# Patient Record
Sex: Male | Born: 1970 | Race: Black or African American | Hispanic: No | Marital: Single | Smoking: Never smoker
Health system: Southern US, Community
[De-identification: ages and names within clinical notes are randomized; demographics above are authoritative.]

## PROBLEM LIST (undated history)

## (undated) DIAGNOSIS — K5790 Diverticulosis of intestine, part unspecified, without perforation or abscess without bleeding: Secondary | ICD-10-CM

## (undated) DIAGNOSIS — F1010 Alcohol abuse, uncomplicated: Secondary | ICD-10-CM

## (undated) HISTORY — PX: INGUINAL HERNIA REPAIR: SHX194

## (undated) HISTORY — PX: HERNIA REPAIR: SHX51

## (undated) HISTORY — PX: DIAGNOSTIC LAPAROSCOPY: SUR761

---

## 2009-10-08 ENCOUNTER — Emergency Department (HOSPITAL_BASED_OUTPATIENT_CLINIC_OR_DEPARTMENT_OTHER): Admission: EM | Admit: 2009-10-08 | Discharge: 2009-10-08 | Payer: Self-pay | Admitting: Emergency Medicine

## 2012-02-17 ENCOUNTER — Emergency Department (HOSPITAL_BASED_OUTPATIENT_CLINIC_OR_DEPARTMENT_OTHER): Payer: Self-pay

## 2012-02-17 ENCOUNTER — Encounter (HOSPITAL_BASED_OUTPATIENT_CLINIC_OR_DEPARTMENT_OTHER): Payer: Self-pay | Admitting: *Deleted

## 2012-02-17 ENCOUNTER — Emergency Department (HOSPITAL_BASED_OUTPATIENT_CLINIC_OR_DEPARTMENT_OTHER)
Admission: EM | Admit: 2012-02-17 | Discharge: 2012-02-17 | Disposition: A | Discharge status: Home / Self Care | Payer: Self-pay | Attending: Emergency Medicine | Admitting: Emergency Medicine

## 2012-02-17 DIAGNOSIS — S92919A Unspecified fracture of unspecified toe(s), initial encounter for closed fracture: Secondary | ICD-10-CM | POA: Insufficient documentation

## 2012-02-17 DIAGNOSIS — Y9239 Other specified sports and athletic area as the place of occurrence of the external cause: Secondary | ICD-10-CM | POA: Insufficient documentation

## 2012-02-17 DIAGNOSIS — Y9366 Activity, soccer: Secondary | ICD-10-CM | POA: Insufficient documentation

## 2012-02-17 DIAGNOSIS — W219XXA Striking against or struck by unspecified sports equipment, initial encounter: Secondary | ICD-10-CM | POA: Insufficient documentation

## 2012-02-17 MED ORDER — OXYCODONE-ACETAMINOPHEN 5-325 MG PO TABS: 2.0000 | ORAL_TABLET | ORAL | Status: DC | PRN

## 2012-02-17 MED ORDER — OXYCODONE-ACETAMINOPHEN 5-325 MG PO TABS
2.0000 | ORAL_TABLET | Freq: Once | ORAL | Status: AC
  Administered 2012-02-17: 2 via ORAL
  Filled 2012-02-17 (×2): qty 2

## 2012-02-17 NOTE — ED Provider Notes (Signed)
History     CSN: [id number]  Arrival date & time 02/17/12  1526   First MD Initiated Contact with Patient 02/17/12 1535      Chief Complaint  Patient presents with  . Foot Injury    (Consider location/radiation/quality/duration/timing/severity/associated sxs/prior treatment) HPI Comments: Patient is a 41 year old male who presents with right foot pain that started earlier today while he was playing soccer. The pain started immediately after the patient went to kick the soccer ball and kicked a rock with his right foot. Patient reports sudden onset of throbbing, severe pain that is localized to right foot. Patient reports progressive worsening of pain. Weight bearing activity makes the pain worse. Nothing makes the pain better. Patient reports associated swelling. Patient has not tried anything for pain relief. Patient denies obvious deformity, numbness/tingling, coolness/weakness of extremity, bruising, and any other injury.      History reviewed. No pertinent past medical history.  Past Surgical History  Procedure Date  . Hernia repair     History reviewed. No pertinent family history.  History  Substance Use Topics  . Smoking status: Never Smoker   . Smokeless tobacco: Not on file  . Alcohol Use: Yes      Review of Systems  Musculoskeletal: Positive for joint swelling and arthralgias.  All other systems reviewed and are negative.    Allergies  Review of patient's allergies indicates no known allergies.  Home Medications  No current outpatient prescriptions on file.  BP 138/73  Pulse 92  Temp 98.7 F (37.1 C) (Oral)  Resp 18  Ht 5\' 10"  (1.778 m)  Wt 170 lb (77.111 kg)  BMI 24.39 kg/m2  SpO2 96%  Physical Exam  Nursing note and vitals reviewed. Constitutional: He appears well-developed and well-nourished. No distress.  HENT:  Head: Normocephalic and atraumatic.  Eyes: Conjunctivae normal and EOM are normal.  Neck: Normal range of motion. Neck supple.   Cardiovascular: Normal rate and regular rhythm.  Exam reveals no gallop and no friction rub.   No murmur heard. Pulmonary/Chest: Effort normal and breath sounds normal. He has no wheezes. He has no rales. He exhibits no tenderness.  Abdominal: Soft. He exhibits no distension.  Musculoskeletal: Normal range of motion.       Tenderness to palpation over first metatarsal of right foot. No obvious swelling or deformity. Patient able to actively move toes distal to injury. Some bruising noted over area of tenderness.   Neurological: He is alert.       Speech is goal-oriented. Moves limbs without ataxia.   Skin: Skin is warm and dry. He is not diaphoretic.       See Musculoskeletal.   Psychiatric: He has a normal mood and affect. His behavior is normal.    ED Course  Procedures (including critical care time)  Labs Reviewed - No data to display Dg Foot Complete Right  02/17/2012  *RADIOLOGY REPORT*  Clinical Data: History of trauma with pain in the right great toe.  RIGHT FOOT COMPLETE - 3+ VIEW  Comparison: No priors.  Findings: Three views of the right foot demonstrate a subtle discontinuity of the cortex along the articular surface of the base of the distal phalanx of the great toe, suspicious for an impaction fracture.  This is noted only on the oblique projection, and is not confidently identified on the other views.  No other acute fracture, subluxation or dislocation is noted.  IMPRESSION: 1.  Findings are suspicious for an acute nondisplaced mildly impacted  fracture through the base of the first distal phalanx. The appearance suggests intra-articular extension.  Clinical correlation is recommended.   Original Report Authenticated By: Trudie Reed, M.D.      1. Phalanx fracture, foot       MDM  4:32 PM Right foot xray pending. Patient will have Percocet for pain.   4:56 PM  Xray shows fracture of distal phalanx of great toe of right foot. Patient will have toes buddy taped and  post op shoe. Patient will have recommended Ortho follow up and be discharged with pain medication.        Emilia Beck, PA-C 02/21/12 0000

## 2012-02-17 NOTE — ED Notes (Signed)
Pt states he was kicking a ball and hit an object instead. C/O pain and swelling to the right foot.

## 2012-02-25 NOTE — ED Provider Notes (Signed)
Medical screening examination/treatment/procedure(s) were performed by non-physician practitioner and as supervising physician I was immediately available for consultation/collaboration.   Rolan Bucco, MD 02/25/12 [phone]

## 2015-03-16 ENCOUNTER — Emergency Department (HOSPITAL_BASED_OUTPATIENT_CLINIC_OR_DEPARTMENT_OTHER)
Admission: EM | Admit: 2015-03-16 | Discharge: 2015-03-16 | Disposition: A | Discharge status: Home / Self Care | Payer: Self-pay | Attending: Emergency Medicine | Admitting: Emergency Medicine

## 2015-03-16 ENCOUNTER — Encounter (HOSPITAL_BASED_OUTPATIENT_CLINIC_OR_DEPARTMENT_OTHER): Payer: Self-pay

## 2015-03-16 DIAGNOSIS — J069 Acute upper respiratory infection, unspecified: Secondary | ICD-10-CM | POA: Insufficient documentation

## 2015-03-16 MED ORDER — AMOXICILLIN-POT CLAVULANATE 875-125 MG PO TABS: 1.0000 | ORAL_TABLET | Freq: Two times a day (BID) | ORAL | Status: DC

## 2015-03-16 NOTE — ED Provider Notes (Signed)
CSN: [id number]     Arrival date & time 03/16/15  1302 History   First MD Initiated Contact with Patient 03/16/15 1310     Chief Complaint  Patient presents with  . Nasal Congestion   HPI Daniel Mcmillan is a 44 y.o. M PMH significant for sinusitis presenting with a 4-5 day history of worsening head congestion and sinus pressure. He states he has been having sinus pressure intermittently since the beginning of November. He describes his pressure as frontal and maxillary sinuses, non-radiating, constant, 2/10 pain scale. He has tried OTC meds (Cold & Sinus) without relief. He denies fevers, chills, chest pain, visual changes, N/V/D.   History reviewed. No pertinent past medical history. Past Surgical History  Procedure Laterality Date  . Hernia repair     No family history on file. Social History  Substance Use Topics  . Smoking status: Never Smoker   . Smokeless tobacco: None  . Alcohol Use: Yes     Comment: daily    Review of Systems  Ten systems are reviewed and are negative for acute change except as noted in the HPI  Allergies  Review of patient's allergies indicates no known allergies.  Home Medications   Prior to Admission medications   Not on File   BP 149/107 mmHg  Pulse 63  Temp(Src) 97.6 F (36.4 C) (Oral)  Resp 16  Ht 5\' 10"  (1.778 m)  Wt 79.379 kg  BMI 25.11 kg/m2  SpO2 100% Physical Exam  Constitutional: He appears well-developed and well-nourished. No distress.  HENT:  Head: Normocephalic and atraumatic.  Right Ear: External ear normal.  Left Ear: External ear normal.  Mouth/Throat: Oropharynx is clear and moist. No oropharyngeal exudate.  Swollen turbinates bilaterally. Frontal sinus TTP.   Eyes: Conjunctivae are normal. Pupils are equal, round, and reactive to light. Right eye exhibits no discharge. Left eye exhibits no discharge. No scleral icterus.  Neck: No tracheal deviation present.  Cardiovascular: Normal rate, regular rhythm, normal heart  sounds and intact distal pulses.  Exam reveals no gallop and no friction rub.   No murmur heard. Pulmonary/Chest: Effort normal and breath sounds normal. No respiratory distress. He has no wheezes. He has no rales. He exhibits no tenderness.  Abdominal: Soft. Bowel sounds are normal. He exhibits no distension and no mass. There is no tenderness. There is no rebound and no guarding.  Musculoskeletal: He exhibits no edema.  Lymphadenopathy:    He has no cervical adenopathy.  Neurological: He is alert. Coordination normal.  Skin: Skin is warm and dry. No rash noted. He is not diaphoretic. No erythema.  Psychiatric: He has a normal mood and affect. His behavior is normal.  Nursing note and vitals reviewed.   ED Course  Procedures   MDM   Final diagnoses:  Upper respiratory infection   Patient non-toxic appearing, afebrile. Based on patient history and physical exam, most likely etiology is URI/sinusitis vs primary headache. Less likely etiologies include hypertensive HA, venous sinus thrombosis, aneurysmal SAH, AVM, acute CVA, carotid dissection, ICH, SAH, SDH, EDH, postconcussive syndrome, meningitis, encephalitis, abscess, mass effect, hydrocephalus, pseudotumor cerebri, glaucoma, trigeminal neuralgia, TMJ syndrome, temporal arteritis, mastoiditis, carbon monoxide poisoning, rebound HA.  Due to longevity of symptoms, will prescribe abx. Patient may be safely discharged home. Discussed reasons for return. Patient to follow-up with ENT as needed. Patient in understanding and agreement with the plan.  Orwell Lions, PA-C 03/19/15 [id number]  Leo Grosser, MD 03/21/15 [phone]

## 2015-03-16 NOTE — ED Notes (Signed)
C/o head congestion, sinus pressure x 4-5 days-NAD

## 2015-03-16 NOTE — Discharge Instructions (Signed)
[name],  Nice meeting you! Please follow-up with ear, nose, and throat if your sinus issues persist. Return to the emergency department if you develop fevers, chills, weakness, slurred speech, facial droop, increased headache. Feel better soon!  S. Wendie Simmer, PA-C   Upper Respiratory Infection, Adult Most upper respiratory infections (URIs) are a viral infection of the air passages leading to the lungs. A URI affects the nose, throat, and upper air passages. The most common type of URI is nasopharyngitis and is typically referred to as "the common cold." URIs run their course and usually go away on their own. Most of the time, a URI does not require medical attention, but sometimes a bacterial infection in the upper airways can follow a viral infection. This is called a secondary infection. Sinus and middle ear infections are common types of secondary upper respiratory infections. Bacterial pneumonia can also complicate a URI. A URI can worsen asthma and chronic obstructive pulmonary disease (COPD). Sometimes, these complications can require emergency medical care and may be life threatening.  CAUSES Almost all URIs are caused by viruses. A virus is a type of germ and can spread from one person to another.  RISKS FACTORS You may be at risk for a URI if:   You smoke.   You have chronic heart or lung disease.  You have a weakened defense (immune) system.   You are very young or very old.   You have nasal allergies or asthma.  You work in crowded or poorly ventilated areas.  You work in health care facilities or schools. SIGNS AND SYMPTOMS  Symptoms typically develop 2-3 days after you come in contact with a cold virus. Most viral URIs last 7-10 days. However, viral URIs from the influenza virus (flu virus) can last 14-18 days and are typically more severe. Symptoms may include:   Runny or stuffy (congested) nose.   Sneezing.   Cough.   Sore throat.   Headache.    Fatigue.   Fever.   Loss of appetite.   Pain in your forehead, behind your eyes, and over your cheekbones (sinus pain).  Muscle aches.  DIAGNOSIS  Your health care provider may diagnose a URI by:  Physical exam.  Tests to check that your symptoms are not due to another condition such as:  Strep throat.  Sinusitis.  Pneumonia.  Asthma. TREATMENT  A URI goes away on its own with time. It cannot be cured with medicines, but medicines may be prescribed or recommended to relieve symptoms. Medicines may help:  Reduce your fever.  Reduce your cough.  Relieve nasal congestion. HOME CARE INSTRUCTIONS   Take medicines only as directed by your health care provider.   Gargle warm saltwater or take cough drops to comfort your throat as directed by your health care provider.  Use a warm mist humidifier or inhale steam from a shower to increase air moisture. This may make it easier to breathe.  Drink enough fluid to keep your urine clear or pale yellow.   Eat soups and other clear broths and maintain good nutrition.   Rest as needed.   Return to work when your temperature has returned to normal or as your health care provider advises. You may need to stay home longer to avoid infecting others. You can also use a face mask and careful hand washing to prevent spread of the virus.  Increase the usage of your inhaler if you have asthma.   Do not use any tobacco  products, including cigarettes, chewing tobacco, or electronic cigarettes. If you need help quitting, ask your health care provider. PREVENTION  The best way to protect yourself from getting a cold is to practice good hygiene.   Avoid oral or hand contact with people with cold symptoms.   Wash your hands often if contact occurs.  There is no clear evidence that vitamin C, vitamin E, echinacea, or exercise reduces the chance of developing a cold. However, it is always recommended to get plenty of rest,  exercise, and practice good nutrition.  SEEK MEDICAL CARE IF:   You are getting worse rather than better.   Your symptoms are not controlled by medicine.   You have chills.  You have worsening shortness of breath.  You have brown or red mucus.  You have yellow or brown nasal discharge.  You have pain in your face, especially when you bend forward.  You have a fever.  You have swollen neck glands.  You have pain while swallowing.  You have white areas in the back of your throat. SEEK IMMEDIATE MEDICAL CARE IF:   You have severe or persistent:  Headache.  Ear pain.  Sinus pain.  Chest pain.  You have chronic lung disease and any of the following:  Wheezing.  Prolonged cough.  Coughing up blood.  A change in your usual mucus.  You have a stiff neck.  You have changes in your:  Vision.  Hearing.  Thinking.  Mood. MAKE SURE YOU:   Understand these instructions.  Will watch your condition.  Will get help right away if you are not doing well or get worse.   This information is not intended to replace advice given to you by your health care provider. Make sure you discuss any questions you have with your health care provider.   Document Released: 09/12/2000 Document Revised: 08/03/2014 Document Reviewed: 06/24/2013 Elsevier Interactive Patient Education [employer].

## 2015-09-16 ENCOUNTER — Encounter (HOSPITAL_BASED_OUTPATIENT_CLINIC_OR_DEPARTMENT_OTHER): Payer: Self-pay | Admitting: *Deleted

## 2015-09-16 ENCOUNTER — Emergency Department (HOSPITAL_BASED_OUTPATIENT_CLINIC_OR_DEPARTMENT_OTHER)
Admission: EM | Admit: 2015-09-16 | Discharge: 2015-09-16 | Disposition: A | Discharge status: Home / Self Care | Payer: BC Managed Care – PPO | Attending: Emergency Medicine | Admitting: Emergency Medicine

## 2015-09-16 ENCOUNTER — Emergency Department (HOSPITAL_BASED_OUTPATIENT_CLINIC_OR_DEPARTMENT_OTHER): Payer: BC Managed Care – PPO

## 2015-09-16 DIAGNOSIS — K5732 Diverticulitis of large intestine without perforation or abscess without bleeding: Secondary | ICD-10-CM | POA: Diagnosis not present

## 2015-09-16 DIAGNOSIS — E279 Disorder of adrenal gland, unspecified: Secondary | ICD-10-CM | POA: Insufficient documentation

## 2015-09-16 DIAGNOSIS — R1032 Left lower quadrant pain: Secondary | ICD-10-CM | POA: Diagnosis present

## 2015-09-16 DIAGNOSIS — E278 Other specified disorders of adrenal gland: Secondary | ICD-10-CM

## 2015-09-16 LAB — COMPREHENSIVE METABOLIC PANEL
ALK PHOS: 57 U/L (ref 38–126)
ALT: 24 U/L (ref 17–63)
AST: 19 U/L (ref 15–41)
Albumin: 4.2 g/dL (ref 3.5–5.0)
Anion gap: 11 (ref 5–15)
BILIRUBIN TOTAL: 1.3 mg/dL — AB (ref 0.3–1.2)
BUN: 10 mg/dL (ref 6–20)
CO2: 24 mmol/L (ref 22–32)
CREATININE: 0.94 mg/dL (ref 0.61–1.24)
Calcium: 9.1 mg/dL (ref 8.9–10.3)
Chloride: 100 mmol/L — ABNORMAL LOW (ref 101–111)
GFR calc Af Amer: >60 mL/min (ref >60)
Glucose, Bld: 109 mg/dL — ABNORMAL HIGH (ref 65–99)
Potassium: 3.4 mmol/L — ABNORMAL LOW (ref 3.5–5.1)
Sodium: 135 mmol/L (ref 135–145)
TOTAL PROTEIN: 8.1 g/dL (ref 6.5–8.1)

## 2015-09-16 LAB — URINALYSIS, ROUTINE W REFLEX MICROSCOPIC
Bilirubin Urine: NEGATIVE
GLUCOSE, UA: NEGATIVE mg/dL
Hgb urine dipstick: NEGATIVE
Ketones, ur: 15 mg/dL — AB
LEUKOCYTES UA: NEGATIVE
Nitrite: NEGATIVE
PROTEIN: NEGATIVE mg/dL
SPECIFIC GRAVITY, URINE: 1.008 (ref 1.005–1.030)
pH: 6 (ref 5.0–8.0)

## 2015-09-16 LAB — CBC WITH DIFFERENTIAL/PLATELET
BASOS ABS: 0 10*3/uL (ref 0.0–0.1)
Basophils Relative: 0 %
Eosinophils Absolute: 0 10*3/uL (ref 0.0–0.7)
Eosinophils Relative: 0 %
HEMATOCRIT: 44.2 % (ref 39.0–52.0)
Hemoglobin: 15.5 g/dL (ref 13.0–17.0)
LYMPHS PCT: 6 %
Lymphs Abs: 0.9 10*3/uL (ref 0.7–4.0)
MCH: 28.5 pg (ref 26.0–34.0)
MCHC: 35.1 g/dL (ref 30.0–36.0)
MCV: 81.4 fL (ref 78.0–100.0)
Monocytes Absolute: 0.6 10*3/uL (ref 0.1–1.0)
Monocytes Relative: 4 %
NEUTROS ABS: 14.2 10*3/uL — AB (ref 1.7–7.7)
Neutrophils Relative %: 90 %
Platelets: 201 10*3/uL (ref 150–400)
RBC: 5.43 MIL/uL (ref 4.22–5.81)
RDW: 14.9 % (ref 11.5–15.5)
WBC: 15.7 10*3/uL — AB (ref 4.0–10.5)

## 2015-09-16 LAB — TROPONIN I: Troponin I: <0.03 ng/mL (ref <0.031)

## 2015-09-16 LAB — LIPASE, BLOOD: LIPASE: 14 U/L (ref 11–51)

## 2015-09-16 MED ORDER — HYDROCODONE-ACETAMINOPHEN 5-325 MG PO TABS: 1.0000 | ORAL_TABLET | ORAL | Status: DC | PRN

## 2015-09-16 MED ORDER — METRONIDAZOLE 500 MG PO TABS: 500.0000 mg | ORAL_TABLET | Freq: Three times a day (TID) | ORAL | Status: DC

## 2015-09-16 MED ORDER — SODIUM CHLORIDE 0.9 % IV BOLUS (SEPSIS)
1000.0000 mL | Freq: Once | INTRAVENOUS | Status: AC
  Administered 2015-09-16: 1000 mL via INTRAVENOUS

## 2015-09-16 MED ORDER — ONDANSETRON HCL 4 MG/2ML IJ SOLN
4.0000 mg | Freq: Once | INTRAMUSCULAR | Status: AC
  Administered 2015-09-16: 4 mg via INTRAVENOUS
  Filled 2015-09-16: qty 2

## 2015-09-16 MED ORDER — ONDANSETRON 4 MG PO TBDP: 4.0000 mg | ORAL_TABLET | Freq: Three times a day (TID) | ORAL | Status: DC | PRN

## 2015-09-16 MED ORDER — IOPAMIDOL (ISOVUE-300) INJECTION 61%
100.0000 mL | Freq: Once | INTRAVENOUS | Status: AC | PRN
  Administered 2015-09-16: 100 mL via INTRAVENOUS

## 2015-09-16 MED ORDER — HYDROCODONE-ACETAMINOPHEN 5-325 MG PO TABS
1.0000 | ORAL_TABLET | Freq: Once | ORAL | Status: AC
  Administered 2015-09-16: 1 via ORAL
  Filled 2015-09-16: qty 1

## 2015-09-16 MED ORDER — CIPROFLOXACIN HCL 500 MG PO TABS: 500.0000 mg | ORAL_TABLET | Freq: Two times a day (BID) | ORAL | Status: DC

## 2015-09-16 NOTE — ED Notes (Signed)
Abdominal pain since yesterday that felt like gas. He gets some relief with passing gas and belching. He vomited yesterday. Diaphoretic on arrival. He states on the way here he felt like he was going to pass out. He took a laxative yesterday and had a small BM with some relief.

## 2015-09-16 NOTE — ED Provider Notes (Signed)
CSN: [id number]     Arrival date & time 09/16/15  1128 History   First MD Initiated Contact with Patient 09/16/15 1200     Chief Complaint  Patient presents with  . Abdominal Pain     (Consider location/radiation/quality/duration/timing/severity/associated sxs/prior Treatment) HPI   Abdominal pain starting yesterday, mostly lower part of abdomen, worse on the left lower side, mild relief with belching or passing gas. Pressure like pain.   Felt better last night after sodas and beer (belching) but then came back again at [facility].  Threw up once last night.  No CP/SOB. On the way here felt lightheaded, hot.  3 loose stool (took laxatives). Has had symptoms like this, however not has painful.     Father has hx of diverticulitis History reviewed. No pertinent past medical history. Past Surgical History  Procedure Laterality Date  . Hernia repair     No family history on file. Social History  Substance Use Topics  . Smoking status: Never Smoker   . Smokeless tobacco: None  . Alcohol Use: Yes     Comment: daily    Review of Systems  Constitutional: Negative for fever and chills.  HENT: Negative for sore throat.   Eyes: Negative for visual disturbance.  Respiratory: Negative for shortness of breath.   Cardiovascular: Negative for chest pain.  Gastrointestinal: Positive for nausea, vomiting, abdominal pain and diarrhea. Negative for constipation.  Genitourinary: Negative for dysuria and difficulty urinating.  Musculoskeletal: Negative for back pain and neck stiffness.  Skin: Negative for rash.  Neurological: Negative for syncope and headaches.      Allergies  Review of patient's allergies indicates no known allergies.  Home Medications   Prior to Admission medications   Medication Sig Start Date End Date Taking? Authorizing Provider  amoxicillin-clavulanate (AUGMENTIN) 875-125 MG tablet Take 1 tablet by mouth 2 (two) times daily. 03/16/15   Adak Lions, PA-C   ciprofloxacin (CIPRO) 500 MG tablet Take 1 tablet (500 mg total) by mouth every 12 (twelve) hours. 09/16/15   Gareth Morgan, MD  HYDROcodone-acetaminophen (NORCO/VICODIN) 5-325 MG tablet Take 1-2 tablets by mouth every 4 (four) hours as needed. 09/16/15   Gareth Morgan, MD  metroNIDAZOLE (FLAGYL) 500 MG tablet Take 1 tablet (500 mg total) by mouth 3 (three) times daily. 09/16/15   Gareth Morgan, MD  ondansetron (ZOFRAN ODT) 4 MG disintegrating tablet Take 1 tablet (4 mg total) by mouth every 8 (eight) hours as needed for nausea or vomiting. 09/16/15   Gareth Morgan, MD   BP 138/91 mmHg  Pulse 89  Temp(Src) 98.3 F (36.8 C) (Oral)  Resp 16  Ht 5\' 10"  (1.778 m)  Wt 172 lb (78.019 kg)  BMI 24.68 kg/m2  SpO2 100% Physical Exam  Constitutional: He is oriented to person, place, and time. He appears well-developed and well-nourished. No distress.  HENT:  Head: Normocephalic and atraumatic.  Eyes: Conjunctivae and EOM are normal.  Neck: Normal range of motion.  Cardiovascular: Normal rate, regular rhythm, normal heart sounds and intact distal pulses.  Exam reveals no gallop and no friction rub.   No murmur heard. Pulmonary/Chest: Effort normal and breath sounds normal. No respiratory distress. He has no wheezes. He has no rales.  Abdominal: Soft. He exhibits no distension. There is tenderness in the suprapubic area and left lower quadrant. There is no guarding and no CVA tenderness.  Musculoskeletal: He exhibits no edema.  Neurological: He is alert and oriented to person, place, and time.  Skin: Skin is  warm and dry. He is not diaphoretic.  Nursing note and vitals reviewed.   ED Course  Procedures (including critical care time) Labs Review Labs Reviewed  URINALYSIS, ROUTINE W REFLEX MICROSCOPIC (NOT AT [facility]) - Abnormal; Notable for the following:    Ketones, ur 15 (*)    All other components within normal limits  CBC WITH DIFFERENTIAL/PLATELET - Abnormal; Notable for the following:     WBC 15.7 (*)    Neutro Abs 14.2 (*)    All other components within normal limits  COMPREHENSIVE METABOLIC PANEL - Abnormal; Notable for the following:    Potassium 3.4 (*)    Chloride 100 (*)    Glucose, Bld 109 (*)    Total Bilirubin 1.3 (*)    All other components within normal limits  LIPASE, BLOOD  TROPONIN I    Imaging Review Ct Abdomen Pelvis W Contrast  09/16/2015  CLINICAL DATA:  Abdominal pain. Evaluate for diverticulitis. Vomiting. EXAM: CT ABDOMEN AND PELVIS WITH CONTRAST TECHNIQUE: Multidetector CT imaging of the abdomen and pelvis was performed using the standard protocol following bolus administration of intravenous contrast. CONTRAST:  177mL ISOVUE-300 IOPAMIDOL (ISOVUE-300) INJECTION 61% COMPARISON:  None. FINDINGS: Lower chest: Normal heart size. No consolidative or nodular pulmonary opacities. No pleural effusion. Hepatobiliary: Liver is normal in size and contour. No focal hepatic lesion is identified. No intrahepatic or extrahepatic biliary ductal dilatation. Too small to characterize sub cm low-attenuation lesion posterior right hepatic lobe (image 27 ; series 2). Pancreas: Unremarkable Spleen: Unremarkable Adrenals/Urinary Tract: Indeterminate 1.9 cm left adrenal nodule with an internal density of 45 Hounsfield units. Right adrenal gland is normal. Kidneys enhance symmetrically with contrast. No hydronephrosis. Urinary bladder is unremarkable. Stomach/Bowel: Circumferential wall thickening of the sigmoid colon with pericolonic fat stranding a small amount of adjacent free fluid. No evidence for abscess formation. Normal appendix. No evidence for bowel obstruction. Normal morphology of the stomach. No free intraperitoneal air. Vascular/Lymphatic: Normal caliber abdominal aorta. No retroperitoneal lymphadenopathy. Other: None. Musculoskeletal: No aggressive or acute appearing osseous lesions. IMPRESSION: Circumferential wall thickening of the sigmoid colon most compatible with  focal acute diverticulitis and/or short segment colitis. No evidence for perforation or surrounding abscess formation. Given the appearance on CT, recommend correlation with colonoscopy after resolution of the acute symptomatology to exclude the possibility of underlying colonic mass. Indeterminate left adrenal nodule measuring 1.9 cm. Recommend follow-up adrenal protocol MRI or CT in 12 months. Electronically Signed   By: Lovey Newcomer M.D.   On: 09/16/2015 14:33   I have personally reviewed and evaluated these images and lab results as part of my medical decision-making.   EKG Interpretation   Date/Time:  Friday September 16 2015 12:42:29 EDT Ventricular Rate:  87 PR Interval:  150 QRS Duration: 139 QT Interval:  402 QTC Calculation: 484 R Axis:   65 Text Interpretation:  Sinus rhythm Probable left atrial enlargement Right  bundle branch block No previous ECGs available Confirmed by [facility] MD,  Duante Arocho ([id number]) on 09/16/2015 12:55:13 PM      MDM   Final diagnoses:  Diverticulitis of large intestine without perforation or abscess without bleeding  Adrenal nodule [facility])   45yo male with no significant medical history presents with concern for lower abdominal pain worse in the LLQ.  Exam, history and CT concerning for acute diverticulitis without perforation or abscess.  Given rx for cipro/flagyl and rx for norco/zofran and recommend PCP follow up as well as GI follow up for colonoscopy.  Also recommend 42mo  follow up for adrenal CT/MR.  Patient discharged in stable condition with understanding of reasons to return.   Gareth Morgan, MD 09/16/15 2139

## 2015-09-16 NOTE — ED Notes (Signed)
Patient reports pain and reports discomfort while sitting down but refuses pain medication.

## 2015-11-24 ENCOUNTER — Ambulatory Visit: Payer: BC Managed Care – PPO | Admitting: Family Medicine

## 2016-01-02 ENCOUNTER — Ambulatory Visit: Payer: BC Managed Care – PPO | Admitting: Family Medicine

## 2016-01-02 DIAGNOSIS — Z0289 Encounter for other administrative examinations: Secondary | ICD-10-CM

## 2016-01-03 ENCOUNTER — Telehealth: Payer: Self-pay

## 2016-01-03 NOTE — Telephone Encounter (Signed)
Patient was No Show for New Patient appointment 10/2, and on 8/24. Charge or No Charge?

## 2016-01-03 NOTE — Telephone Encounter (Signed)
If he has now had 2 no shows please charge and do not reschedule to see me- thank you

## 2017-03-21 ENCOUNTER — Encounter (HOSPITAL_BASED_OUTPATIENT_CLINIC_OR_DEPARTMENT_OTHER): Payer: Self-pay | Admitting: Emergency Medicine

## 2017-03-21 ENCOUNTER — Other Ambulatory Visit: Payer: Self-pay

## 2017-03-21 ENCOUNTER — Emergency Department (HOSPITAL_BASED_OUTPATIENT_CLINIC_OR_DEPARTMENT_OTHER)
Admission: EM | Admit: 2017-03-21 | Discharge: 2017-03-21 | Disposition: A | Discharge status: Home / Self Care | Payer: BC Managed Care – PPO | Attending: Emergency Medicine | Admitting: Emergency Medicine

## 2017-03-21 DIAGNOSIS — N3000 Acute cystitis without hematuria: Secondary | ICD-10-CM

## 2017-03-21 DIAGNOSIS — Z79899 Other long term (current) drug therapy: Secondary | ICD-10-CM | POA: Insufficient documentation

## 2017-03-21 DIAGNOSIS — N3090 Cystitis, unspecified without hematuria: Secondary | ICD-10-CM | POA: Insufficient documentation

## 2017-03-21 LAB — URINALYSIS, ROUTINE W REFLEX MICROSCOPIC
Bilirubin Urine: NEGATIVE
GLUCOSE, UA: 100 mg/dL — AB
KETONES UR: NEGATIVE mg/dL
LEUKOCYTES UA: NEGATIVE
Nitrite: POSITIVE — AB
PH: 5.5 (ref 5.0–8.0)
Protein, ur: 100 mg/dL — AB

## 2017-03-21 LAB — URINALYSIS, MICROSCOPIC (REFLEX)
BACTERIA UA: NONE SEEN
SQUAMOUS EPITHELIAL / LPF: NONE SEEN

## 2017-03-21 MED ORDER — CEPHALEXIN 500 MG PO CAPS: 500.0000 mg | ORAL_CAPSULE | Freq: Three times a day (TID) | ORAL | 0 refills | Status: DC

## 2017-03-21 MED FILL — CEPHALEXIN 500 MG CAPSULE: 500 | 7 days supply | Qty: 21 | Fill #0

## 2017-03-21 NOTE — ED Triage Notes (Signed)
Pt c/o urinary frequency and discomfort with urination

## 2017-03-21 NOTE — ED Provider Notes (Signed)
[facility] EMERGENCY DEPARTMENT Provider Note   CSN: [id number] Arrival date & time: 03/21/17  0630     History   Chief Complaint Chief Complaint  Patient presents with  . Urinary Frequency    HPI Daniel Mcmillan is a 46 y.o. male.  The history is provided by the patient. No language interpreter was used.  Urinary Frequency    Daniel Mcmillan is a 46 y.o. male who presents to the Emergency Department complaining of urinary frequency.  He reports 1 week of urinary frequency and urgency.  Symptoms have been constant and moderate in nature.  Over the last 2 days he has tried Azo with partial improvement.  No fevers, nausea, vomiting.  He does have some dysuria at the tip of his penis at the end of urination.  No prior similar symptoms.  He has no medical problems and takes no medications. History reviewed. No pertinent past medical history.  There are no active problems to display for this patient.   Past Surgical History:  Procedure Laterality Date  . HERNIA REPAIR         Home Medications    Prior to Admission medications   Medication Sig Start Date End Date Taking? Authorizing Provider  Phenazopyridine HCl (AZO TABS PO) Take by mouth.   Yes [provider]    Family History No family history on file.  Social History Social History   Tobacco Use  . Smoking status: Never Smoker  . Smokeless tobacco: Never Used  Substance Use Topics  . Alcohol use: Yes    Comment: daily  . Drug use: Yes    Types: Marijuana     Allergies   Patient has no known allergies.   Review of Systems Review of Systems  Genitourinary: Positive for frequency.  All other systems reviewed and are negative.    Physical Exam Updated Vital Signs BP (!) 162/94 (BP Location: Left Arm)   Pulse 91   Temp 97.9 F (36.6 C) (Oral)   Resp 18   Ht 5\' 10"  (1.778 m)   Wt 77.1 kg (170 lb)   SpO2 100%   BMI 24.39 kg/m   Physical Exam  Constitutional: He is oriented to  person, place, and time. He appears well-developed and well-nourished.  HENT:  Head: Normocephalic and atraumatic.  Cardiovascular: Normal rate and regular rhythm.  No murmur heard. Pulmonary/Chest: Effort normal and breath sounds normal. No respiratory distress.  Abdominal: Soft. There is no tenderness. There is no rebound and no guarding.  Genitourinary: Rectum normal and penis normal.  Genitourinary Comments: No penile lesions.  No inguinal lymphadenopathy.  No prostatic tenderness to palpation.  Musculoskeletal: He exhibits no edema or tenderness.  Neurological: He is alert and oriented to person, place, and time.  Skin: Skin is warm and dry.  Psychiatric: He has a normal mood and affect. His behavior is normal.  Nursing note and vitals reviewed.    ED Treatments / Results  Labs (all labs ordered are listed, but only abnormal results are displayed) Labs Reviewed  URINALYSIS, ROUTINE W REFLEX MICROSCOPIC - Abnormal; Notable for the following components:      Result Value   Color, Urine ORANGE (*)    Specific Gravity, Urine >1.030 (*)    Glucose, UA 100 (*)    Hgb urine dipstick SMALL (*)    Protein, ur 100 (*)    Nitrite POSITIVE (*)    All other components within normal limits  URINE CULTURE  URINALYSIS, MICROSCOPIC (REFLEX)  GC/CHLAMYDIA PROBE AMP ([place]) NOT AT [facility]    EKG  EKG Interpretation None       Radiology No results found.  Procedures Procedures (including critical care time)  Medications Ordered in ED Medications - No data to display   Initial Impression / Assessment and Plan / ED Course  I have reviewed the triage vital signs and the nursing notes.  Pertinent labs & imaging results that were available during my care of the patient were reviewed by me and considered in my medical decision making (see chart for details).     Patient here for evaluation of 1 week of urinary frequency and urgency.  He is nontoxic appearing on examination.   UA is difficult to interpret given patient's Pyridium use.  Will treat for cystitis based on his symptoms.  No clinical evidence of prostatitis.  Presentation is not consistent with obstructing stone, renal failure.  Counseled patient on home care for cystitis.  Discussed outpatient follow-up and return precautions.  Final Clinical Impressions(s) / ED Diagnoses   Final diagnoses:  Acute cystitis without hematuria    ED Discharge Orders    None       Quintella Reichert, MD 03/21/17 [phone]

## 2017-03-22 LAB — URINE CULTURE: CULTURE: NO GROWTH

## 2017-03-22 LAB — GC/CHLAMYDIA PROBE AMP (~~LOC~~) NOT AT ARMC
Chlamydia: NEGATIVE
NEISSERIA GONORRHEA: NEGATIVE

## 2017-05-09 ENCOUNTER — Other Ambulatory Visit: Payer: Self-pay

## 2017-05-09 ENCOUNTER — Encounter (HOSPITAL_BASED_OUTPATIENT_CLINIC_OR_DEPARTMENT_OTHER): Payer: Self-pay | Admitting: Emergency Medicine

## 2017-05-09 ENCOUNTER — Inpatient Hospital Stay (HOSPITAL_BASED_OUTPATIENT_CLINIC_OR_DEPARTMENT_OTHER)
Admission: EM | Admit: 2017-05-09 | Discharge: 2017-05-17 | DRG: 391 | Disposition: A | Discharge status: Home / Self Care | Payer: Self-pay | Attending: Surgery | Admitting: Surgery

## 2017-05-09 ENCOUNTER — Emergency Department (HOSPITAL_BASED_OUTPATIENT_CLINIC_OR_DEPARTMENT_OTHER): Payer: Self-pay

## 2017-05-09 DIAGNOSIS — K5720 Diverticulitis of large intestine with perforation and abscess without bleeding: Principal | ICD-10-CM | POA: Diagnosis present

## 2017-05-09 DIAGNOSIS — F1010 Alcohol abuse, uncomplicated: Secondary | ICD-10-CM | POA: Diagnosis present

## 2017-05-09 DIAGNOSIS — E876 Hypokalemia: Secondary | ICD-10-CM | POA: Diagnosis present

## 2017-05-09 DIAGNOSIS — D649 Anemia, unspecified: Secondary | ICD-10-CM

## 2017-05-09 DIAGNOSIS — B9620 Unspecified Escherichia coli [E. coli] as the cause of diseases classified elsewhere: Secondary | ICD-10-CM | POA: Diagnosis present

## 2017-05-09 DIAGNOSIS — I10 Essential (primary) hypertension: Secondary | ICD-10-CM | POA: Diagnosis present

## 2017-05-09 DIAGNOSIS — D729 Disorder of white blood cells, unspecified: Secondary | ICD-10-CM

## 2017-05-09 DIAGNOSIS — K5792 Diverticulitis of intestine, part unspecified, without perforation or abscess without bleeding: Secondary | ICD-10-CM

## 2017-05-09 DIAGNOSIS — R1032 Left lower quadrant pain: Secondary | ICD-10-CM

## 2017-05-09 DIAGNOSIS — K5900 Constipation, unspecified: Secondary | ICD-10-CM | POA: Diagnosis present

## 2017-05-09 DIAGNOSIS — D473 Essential (hemorrhagic) thrombocythemia: Secondary | ICD-10-CM

## 2017-05-09 DIAGNOSIS — F1290 Cannabis use, unspecified, uncomplicated: Secondary | ICD-10-CM | POA: Diagnosis present

## 2017-05-09 DIAGNOSIS — Z1611 Resistance to penicillins: Secondary | ICD-10-CM | POA: Diagnosis present

## 2017-05-09 DIAGNOSIS — D75839 Thrombocytosis, unspecified: Secondary | ICD-10-CM

## 2017-05-09 DIAGNOSIS — K651 Peritoneal abscess: Secondary | ICD-10-CM | POA: Diagnosis present

## 2017-05-09 HISTORY — DX: Diverticulosis of intestine, part unspecified, without perforation or abscess without bleeding: K57.90

## 2017-05-09 LAB — COMPREHENSIVE METABOLIC PANEL
ALT: 15 U/L — ABNORMAL LOW (ref 17–63)
ANION GAP: 10 (ref 5–15)
AST: 16 U/L (ref 15–41)
Albumin: 3.4 g/dL — ABNORMAL LOW (ref 3.5–5.0)
Alkaline Phosphatase: 59 U/L (ref 38–126)
BUN: 8 mg/dL (ref 6–20)
CHLORIDE: 101 mmol/L (ref 101–111)
CO2: 26 mmol/L (ref 22–32)
Calcium: 8.5 mg/dL — ABNORMAL LOW (ref 8.9–10.3)
Creatinine, Ser: 1 mg/dL (ref 0.61–1.24)
GFR calc non Af Amer: >60 mL/min (ref >60)
Glucose, Bld: 96 mg/dL (ref 65–99)
POTASSIUM: 3.6 mmol/L (ref 3.5–5.1)
SODIUM: 137 mmol/L (ref 135–145)
Total Bilirubin: 0.3 mg/dL (ref 0.3–1.2)
Total Protein: 7.8 g/dL (ref 6.5–8.1)

## 2017-05-09 LAB — URINALYSIS, ROUTINE W REFLEX MICROSCOPIC
BILIRUBIN URINE: NEGATIVE
Glucose, UA: NEGATIVE mg/dL
HGB URINE DIPSTICK: NEGATIVE
Ketones, ur: NEGATIVE mg/dL
Leukocytes, UA: NEGATIVE
Nitrite: NEGATIVE
PROTEIN: NEGATIVE mg/dL
Specific Gravity, Urine: 1.01 (ref 1.005–1.030)
pH: 7.5 (ref 5.0–8.0)

## 2017-05-09 LAB — CBC WITH DIFFERENTIAL/PLATELET
BASOS PCT: 0 %
Basophils Absolute: 0 10*3/uL (ref 0.0–0.1)
EOS ABS: 0.1 10*3/uL (ref 0.0–0.7)
EOS PCT: 1 %
HCT: 37.7 % — ABNORMAL LOW (ref 39.0–52.0)
HEMOGLOBIN: 12.8 g/dL — AB (ref 13.0–17.0)
LYMPHS PCT: 14 %
Lymphs Abs: 1.6 10*3/uL (ref 0.7–4.0)
MCH: 27.4 pg (ref 26.0–34.0)
MCHC: 34 g/dL (ref 30.0–36.0)
MCV: 80.6 fL (ref 78.0–100.0)
Monocytes Absolute: 1 10*3/uL (ref 0.1–1.0)
Monocytes Relative: 9 %
NEUTROS PCT: 76 %
Neutro Abs: 8.4 10*3/uL — ABNORMAL HIGH (ref 1.7–7.7)
Platelets: 431 10*3/uL — ABNORMAL HIGH (ref 150–400)
RBC: 4.68 MIL/uL (ref 4.22–5.81)
RDW: 13.8 % (ref 11.5–15.5)
WBC: 11.1 10*3/uL — ABNORMAL HIGH (ref 4.0–10.5)

## 2017-05-09 MED ORDER — ONDANSETRON HCL 4 MG/2ML IJ SOLN: 4.0000 mg | Freq: Four times a day (QID) | INTRAMUSCULAR | Status: DC | PRN

## 2017-05-09 MED ORDER — LACTATED RINGERS IV BOLUS (SEPSIS)
1000.0000 mL | Freq: Once | INTRAVENOUS | Status: AC
  Administered 2017-05-09: 1000 mL via INTRAVENOUS

## 2017-05-09 MED ORDER — PIPERACILLIN-TAZOBACTAM 3.375 G IVPB
3.3750 g | Freq: Three times a day (TID) | INTRAVENOUS | Status: DC
  Administered 2017-05-09 – 2017-05-16 (×20): 3.375 g via INTRAVENOUS
  Filled 2017-05-09 (×20): qty 50

## 2017-05-09 MED ORDER — ONDANSETRON 4 MG PO TBDP: 4.0000 mg | ORAL_TABLET | Freq: Four times a day (QID) | ORAL | Status: DC | PRN

## 2017-05-09 MED ORDER — LACTATED RINGERS IV BOLUS (SEPSIS): 1000.0000 mL | Freq: Three times a day (TID) | INTRAVENOUS | Status: AC | PRN

## 2017-05-09 MED ORDER — THIAMINE HCL 100 MG/ML IJ SOLN: 100.0000 mg | Freq: Every day | INTRAMUSCULAR | Status: DC

## 2017-05-09 MED ORDER — FOLIC ACID 1 MG PO TABS
1.0000 mg | ORAL_TABLET | Freq: Every day | ORAL | Status: DC
  Administered 2017-05-09 – 2017-05-17 (×9): 1 mg via ORAL
  Filled 2017-05-09 (×9): qty 1

## 2017-05-09 MED ORDER — IOPAMIDOL (ISOVUE-300) INJECTION 61%
100.0000 mL | Freq: Once | INTRAVENOUS | Status: AC | PRN
  Administered 2017-05-09: 100 mL via INTRAVENOUS

## 2017-05-09 MED ORDER — LACTATED RINGERS IV SOLN
INTRAVENOUS | Status: DC
  Administered 2017-05-09 – 2017-05-16 (×12): via INTRAVENOUS

## 2017-05-09 MED ORDER — LORAZEPAM 1 MG PO TABS: 1.0000 mg | ORAL_TABLET | Freq: Four times a day (QID) | ORAL | Status: AC | PRN

## 2017-05-09 MED ORDER — PROCHLORPERAZINE EDISYLATE 5 MG/ML IJ SOLN: 5.0000 mg | INTRAMUSCULAR | Status: DC | PRN

## 2017-05-09 MED ORDER — MORPHINE SULFATE (PF) 4 MG/ML IV SOLN
4.0000 mg | Freq: Once | INTRAVENOUS | Status: AC
  Administered 2017-05-09: 4 mg via INTRAVENOUS
  Filled 2017-05-09: qty 1

## 2017-05-09 MED ORDER — GUAIFENESIN-DM 100-10 MG/5ML PO SYRP: 10.0000 mL | ORAL_SOLUTION | ORAL | Status: DC | PRN

## 2017-05-09 MED ORDER — METOCLOPRAMIDE HCL 5 MG/ML IJ SOLN: 10.0000 mg | Freq: Four times a day (QID) | INTRAMUSCULAR | Status: DC | PRN

## 2017-05-09 MED ORDER — DIPHENHYDRAMINE HCL 50 MG/ML IJ SOLN: 12.5000 mg | Freq: Four times a day (QID) | INTRAMUSCULAR | Status: DC | PRN

## 2017-05-09 MED ORDER — MENTHOL 3 MG MT LOZG: 1.0000 | LOZENGE | OROMUCOSAL | Status: DC | PRN

## 2017-05-09 MED ORDER — ACETAMINOPHEN 325 MG PO TABS: 650.0000 mg | ORAL_TABLET | Freq: Four times a day (QID) | ORAL | Status: DC | PRN

## 2017-05-09 MED ORDER — PIPERACILLIN-TAZOBACTAM 3.375 G IVPB 30 MIN
3.3750 g | Freq: Once | INTRAVENOUS | Status: AC
  Administered 2017-05-09: 3.375 g via INTRAVENOUS
  Filled 2017-05-09 (×2): qty 50

## 2017-05-09 MED ORDER — LORAZEPAM 2 MG/ML IJ SOLN: 1.0000 mg | Freq: Four times a day (QID) | INTRAMUSCULAR | Status: AC | PRN

## 2017-05-09 MED ORDER — VITAMIN B-1 100 MG PO TABS
100.0000 mg | ORAL_TABLET | Freq: Every day | ORAL | Status: DC
  Administered 2017-05-09 – 2017-05-17 (×9): 100 mg via ORAL
  Filled 2017-05-09 (×9): qty 1

## 2017-05-09 MED ORDER — DIPHENHYDRAMINE HCL 12.5 MG/5ML PO ELIX: 12.5000 mg | ORAL_SOLUTION | Freq: Four times a day (QID) | ORAL | Status: DC | PRN

## 2017-05-09 MED ORDER — ADULT MULTIVITAMIN W/MINERALS CH
1.0000 | ORAL_TABLET | Freq: Every day | ORAL | Status: DC
  Administered 2017-05-09 – 2017-05-17 (×9): 1 via ORAL
  Filled 2017-05-09 (×9): qty 1

## 2017-05-09 MED ORDER — MAGIC MOUTHWASH
15.0000 mL | Freq: Four times a day (QID) | ORAL | Status: DC | PRN
  Filled 2017-05-09: qty 15

## 2017-05-09 MED ORDER — ONDANSETRON HCL 4 MG/2ML IJ SOLN
4.0000 mg | Freq: Once | INTRAMUSCULAR | Status: AC
  Administered 2017-05-09: 4 mg via INTRAVENOUS
  Filled 2017-05-09: qty 2

## 2017-05-09 MED ORDER — METHOCARBAMOL 1000 MG/10ML IJ SOLN
1000.0000 mg | Freq: Four times a day (QID) | INTRAVENOUS | Status: DC | PRN
  Administered 2017-05-10 (×2): 1000 mg via INTRAVENOUS
  Filled 2017-05-09 (×3): qty 10

## 2017-05-09 MED ORDER — ENOXAPARIN SODIUM 40 MG/0.4ML ~~LOC~~ SOLN
40.0000 mg | SUBCUTANEOUS | Status: DC
  Filled 2017-05-09 (×2): qty 0.4

## 2017-05-09 MED ORDER — METOCLOPRAMIDE HCL 5 MG/ML IJ SOLN
10.0000 mg | Freq: Once | INTRAMUSCULAR | Status: AC
  Administered 2017-05-09: 10 mg via INTRAVENOUS
  Filled 2017-05-09: qty 2

## 2017-05-09 MED ORDER — HYDROMORPHONE HCL 1 MG/ML IJ SOLN
0.5000 mg | INTRAMUSCULAR | Status: DC | PRN
  Administered 2017-05-09 – 2017-05-10 (×6): 0.5 mg via INTRAVENOUS
  Filled 2017-05-09: qty 0.5
  Filled 2017-05-09: qty 1
  Filled 2017-05-09 (×4): qty 0.5

## 2017-05-09 MED ORDER — ALUM & MAG HYDROXIDE-SIMETH 200-200-20 MG/5ML PO SUSP: 30.0000 mL | Freq: Four times a day (QID) | ORAL | Status: DC | PRN

## 2017-05-09 MED ORDER — ACETAMINOPHEN 650 MG RE SUPP: 650.0000 mg | Freq: Four times a day (QID) | RECTAL | Status: DC | PRN

## 2017-05-09 MED ORDER — PHENOL 1.4 % MT LIQD: 1.0000 | OROMUCOSAL | Status: DC | PRN

## 2017-05-09 MED ORDER — SODIUM CHLORIDE 0.9 % IV BOLUS (SEPSIS)
500.0000 mL | Freq: Once | INTRAVENOUS | Status: AC
  Administered 2017-05-09: 500 mL via INTRAVENOUS

## 2017-05-09 MED ORDER — HYDROCORTISONE 1 % EX CREA: 1.0000 "application " | TOPICAL_CREAM | Freq: Three times a day (TID) | CUTANEOUS | Status: DC | PRN

## 2017-05-09 MED ORDER — SIMETHICONE 80 MG PO CHEW: 40.0000 mg | CHEWABLE_TABLET | Freq: Four times a day (QID) | ORAL | Status: DC | PRN

## 2017-05-09 MED ORDER — HYDROCORTISONE 2.5 % RE CREA: 1.0000 "application " | TOPICAL_CREAM | Freq: Four times a day (QID) | RECTAL | Status: DC | PRN

## 2017-05-09 NOTE — Progress Notes (Signed)
Request received for pelvic abscess drainage placement on patient.  Imaging studies were reviewed by Dr. Vernard Gambles.  At this time abscess is not safely accessible for percutaneous drainage.  Above discussed with Dr. Johney Maine.

## 2017-05-09 NOTE — ED Triage Notes (Signed)
Per [facility] pt is coming from [facility] has had LLQ pain x4 days. CT showed abscess and diverticulosis.

## 2017-05-09 NOTE — ED Notes (Signed)
Pt leaving Ed with [facility] at this time.

## 2017-05-09 NOTE — ED Provider Notes (Signed)
Emergency Department Provider Note   I have reviewed the triage vital signs and the nursing notes.   HISTORY  Chief Complaint Abdominal Pain   HPI Daniel Mcmillan is a 47 y.o. male with PMH of diverticulitis presents to the emergency department for evaluation of left lower quadrant pain.  Pain began 2 weeks ago and then seemed to improve.  Several days ago the pain returned.  He has had some associated bloating and constipation.  Denies any diarrhea or blood in the stools.  The patient states that since 2017 had approximately 7 flares of a similar type pain.  He was diagnosed with diverticulitis in the past.  He has not followed up with a gastroenterologist or had a colonoscopy to further investigate the symptoms.  He denies any pain in the testicles.  No dysuria, hesitancy, urgency.  Pain is worse with movement or pressing in the left lower quadrant.  Denies dysuria, hesitancy, urgency.  No chest discomfort.   Past Medical History:  Diagnosis Date  . Diverticulosis     There are no active problems to display for this patient.   Past Surgical History:  Procedure Laterality Date  . HERNIA REPAIR      Current Outpatient Rx  . Order #: [id number] Class: Print  . Order #: [id number] Class: Historical Med    Allergies Patient has no known allergies.  No family history on file.  Social History Social History   Tobacco Use  . Smoking status: Never Smoker  . Smokeless tobacco: Never Used  Substance Use Topics  . Alcohol use: Yes    Alcohol/week: 3.0 oz    Types: 5 Cans of beer per week    Comment: daily  . Drug use: Yes    Types: Marijuana    Comment: Last used 05/08/17    Review of Systems  Constitutional: No fever/chills Eyes: No visual changes. ENT: No sore throat. Cardiovascular: Denies chest pain. Respiratory: Denies shortness of breath. Gastrointestinal: Positive LLQ abdominal pain.  No nausea, no vomiting.  No diarrhea. Positive constipation. Genitourinary:  Negative for dysuria. Musculoskeletal: Negative for back pain. Skin: Negative for rash. Neurological: Negative for headaches, focal weakness or numbness.  10-point ROS otherwise negative.  ____________________________________________   PHYSICAL EXAM:  VITAL SIGNS: ED Triage Vitals  Enc Vitals Group     BP 05/09/17 1032 (!) 143/86     Pulse Rate 05/09/17 1032 90     Resp 05/09/17 1032 20     Temp 05/09/17 1032 98.3 F (36.8 C)     Temp Source 05/09/17 1032 Oral     SpO2 05/09/17 1032 100 %     Weight 05/09/17 1041 170 lb (77.1 kg)     Height 05/09/17 1041 5\' 10"  (1.778 m)     Pain Score 05/09/17 1041 1   Constitutional: Alert and oriented. Well appearing and in no acute distress. Eyes: Conjunctivae are normal.  Head: Atraumatic. Nose: No congestion/rhinnorhea. Mouth/Throat: Mucous membranes are moist.  Neck: No stridor.  Cardiovascular: Normal rate, regular rhythm. Good peripheral circulation. Grossly normal heart sounds.   Respiratory: Normal respiratory effort.  No retractions. Lungs CTAB. Gastrointestinal: Soft with focal tenderness in the LLQ without rebound or guarding. No distention.  Musculoskeletal: No lower extremity tenderness nor edema. No gross deformities of extremities. Neurologic:  Normal speech and language. No gross focal neurologic deficits are appreciated.  Skin:  Skin is warm, dry and intact. No rash noted.  ____________________________________________   LABS (all labs ordered are listed, but only abnormal  results are displayed)  Labs Reviewed  COMPREHENSIVE METABOLIC PANEL - Abnormal; Notable for the following components:      Result Value   Calcium 8.5 (*)    Albumin 3.4 (*)    ALT 15 (*)    All other components within normal limits  CBC WITH DIFFERENTIAL/PLATELET - Abnormal; Notable for the following components:   WBC 11.1 (*)    Hemoglobin 12.8 (*)    HCT 37.7 (*)    Platelets 431 (*)    Neutro Abs 8.4 (*)    All other components within  normal limits  URINALYSIS, ROUTINE W REFLEX MICROSCOPIC   ____________________________________________  RADIOLOGY  Ct Abdomen Pelvis W Contrast  Result Date: 05/09/2017 CLINICAL DATA:  LEFT abdominal and lower abdominal pain with constipation since Saturday, similar symptoms on 04/25/2017, diverticulitis suspected, history of diverticulosis EXAM: CT ABDOMEN AND PELVIS WITH CONTRAST TECHNIQUE: Multidetector CT imaging of the abdomen and pelvis was performed using the standard protocol following bolus administration of intravenous contrast. Sagittal and coronal MPR images reconstructed from axial data set. CONTRAST:  147mL ISOVUE-300 IOPAMIDOL (ISOVUE-300) INJECTION 61% IV. Dilute oral contrast. COMPARISON:  09/16/2015 FINDINGS: Lower chest: Lung bases clear Hepatobiliary: Tiny cysts RIGHT lobe liver. Gallbladder and liver otherwise normal appearance. Pancreas: Normal appearance Spleen: Normal appearance Adrenals/Urinary Tract: Adrenal thickening with a discrete 2.1 x 1.8 cm diameter LEFT adrenal mass image 26 grossly stable, demonstrating significant washout on delayed images consistent with adrenal adenoma. Kidneys and ureters normal appearance. Mild thickening of the LEFT superior bladder wall by sigmoid process, see below. Stomach/Bowel: Appendix not visualized but no definite with pericecal inflammatory process seen. Short segment of jejuno-jejunal intussusception in the LEFT mid abdomen image 53, approximately 3 cm length without obstruction. Significant wall thickening of the sigmoid colon with extensive pericolic infiltrative changes and note of scattered colonic diverticula, favoring acute diverticulitis over colitis. Adjacent extraluminal collection containing fluid and gas measuring 4.4 x 2.9 x 3.0 cm with consistent with diverticular abscess. Diffuse infiltration in sigmoid mesocolon. Edema of fat planes extending into perirectal space. Thickening of a few small bowel loops in the RIGHT pelvis.  Contrast had not yet reached the sigmoid colon at time of imaging to assess for extravasation. Stomach and remaining bowel loops unremarkable. Vascular/Lymphatic: Minimal atherosclerotic calcification distal aorta and iliac arteries. Aorta normal caliber. No adenopathy. Scattered normal size retroperitoneal nodes. Reproductive: Mild prostatic enlargement, gland 4.4 x 4.0 cm image 81. Seminal vesicles unremarkable. Other: Minimal ascites.  No free air.  No hernia. Musculoskeletal: Osseous structures unremarkable. IMPRESSION: Extensive inflammatory process of the sigmoid colon with colonic wall thickening and pericolic infiltrative changes most likely representing acute diverticulitis. Adjacent diverticular abscess 4.4 x 2.9 x 3.0 cm containing fluid and air. Short segment of jejuno-jejunal intussusception in the LEFT mid abdomen without evidence of bowel obstruction; uncertain if this represents a fixed or transient intussusception. Associated thickening of bladder wall and multiple small bowel loops in pelvis. Stable LEFT adrenal adenoma. Mild prostatic enlargement. Electronically Signed   By: Lavonia Dana M.D.   On: 05/09/2017 13:24    ____________________________________________   PROCEDURES  Procedure(s) performed:   Procedures  None ____________________________________________   INITIAL IMPRESSION / ASSESSMENT AND PLAN / ED COURSE  Pertinent labs & imaging results that were available during my care of the patient were reviewed by me and considered in my medical decision making (see chart for details).  Patient presents to the emergency department for evaluation of acute on chronic left lower quadrant abdominal pain.  Last CT scan reviewed from 2017 showed acute diverticulitis/colitis at that time.  He has not followed up with gastroenterology or had a colonoscopy since then.  Given his focal tenderness plan for repeat CT today along with lab work and IV fluids.  Pain is mild at this time.   Plan to confirm this and rule out complication.  Low suspicion for vascular cause of pain.   01:53 PM Reviewed the labs and CT imaging noted above. Discussed case with Dr. Johney Maine, general surgery.  He is requesting an ED to ED transfer to [facility] where his team can evaluate the patient prior to admission. He request that the PA be paged when the patient arrives. I spoke with Dr. Wilson Singer regarding this patient and he accepts the patient in transfer to the ED.  Zosyn started.  Will keep the patient n.p.o. and arrange transport. ____________________________________________  FINAL CLINICAL IMPRESSION(S) / ED DIAGNOSES  Final diagnoses:  Diverticulitis  Intra-abdominal abscess ([place])     MEDICATIONS GIVEN DURING THIS VISIT:  Medications  sodium chloride 0.9 % bolus 500 mL (500 mLs Intravenous New Bag/Given 05/09/17 1142)  morphine 4 MG/ML injection 4 mg (4 mg Intravenous Given 05/09/17 1143)  ondansetron (ZOFRAN) injection 4 mg (4 mg Intravenous Given 05/09/17 1142)  iopamidol (ISOVUE-300) 61 % injection 100 mL (100 mLs Intravenous Contrast Given 05/09/17 1256)  piperacillin-tazobactam (ZOSYN) IVPB 3.375 g (3.375 g Intravenous New Bag/Given 05/09/17 1345)  metoCLOPramide (REGLAN) injection 10 mg (10 mg Intravenous Given 05/09/17 1356)    Note:  This document was prepared using Dragon voice recognition software and may include unintentional dictation errors.  Nanda Quinton, MD Emergency Medicine    Armya Westerhoff, [facility], MD 05/09/17 [phone]

## 2017-05-09 NOTE — ED Notes (Signed)
ED TO INPATIENT HANDOFF REPORT  Name/Age/Gender Daniel Mcmillan 47 y.o. male  Code Status    Code Status Orders  (From admission, onward)        Start     Ordered   05/09/17 1559  Full code  Continuous     05/09/17 1602    Code Status History    Date Active Date Inactive Code Status Order ID Comments User Context   This patient has a current code status but no historical code status.      Home/SNF/Other Home  Chief Complaint PAIN  Level of Care/Admitting Diagnosis ED Disposition    ED Disposition Condition Comment   Admit  Hospital Area: [facility] [[id number]]  Level of Care: Med-Surg [16]  Diagnosis: Diverticulitis of large intestine with perforation and abscess [[id number]]  Admitting Physician: [facility], [place]  Attending Physician: [facility], MD [[id number]]  Estimated length of stay: 5 - 7 days  Certification:: I certify this patient will need inpatient services for at least 2 midnights  PT Class (Do Not Modify): Inpatient [101]  PT Acc Code (Do Not Modify): Private [1]       Medical History Past Medical History:  Diagnosis Date  . Diverticulosis     Allergies No Known Allergies  IV Location/Drains/Wounds Patient Lines/Drains/Airways Status   Active Line/Drains/Airways    Name:   Placement date:   Placement time:   Site:   Days:   Peripheral IV 05/09/17 Right Antecubital   05/09/17    1130    Antecubital   less than 1          Labs/Imaging Results for orders placed or performed during the hospital encounter of 05/09/17 (from the past 48 hour(s))  Urinalysis, Routine w reflex microscopic     Status: None   Collection Time: 05/09/17 11:15 AM  Result Value Ref Range   Color, Urine YELLOW YELLOW   APPearance CLEAR CLEAR   Specific Gravity, Urine 1.010 1.005 - 1.030   pH 7.5 5.0 - 8.0   Glucose, UA NEGATIVE NEGATIVE mg/dL   Hgb urine dipstick NEGATIVE NEGATIVE   Bilirubin Urine NEGATIVE NEGATIVE   Ketones, ur NEGATIVE NEGATIVE mg/dL    Protein, ur NEGATIVE NEGATIVE mg/dL   Nitrite NEGATIVE NEGATIVE   Leukocytes, UA NEGATIVE NEGATIVE    Comment: Microscopic not done on urines with negative protein, blood, leukocytes, nitrite, or glucose < 500 mg/dL. Performed at [facility], [address] [place], [place] [address]   Comprehensive metabolic panel     Status: Abnormal   Collection Time: 05/09/17 11:25 AM  Result Value Ref Range   Sodium 137 135 - 145 mmol/L   Potassium 3.6 3.5 - 5.1 mmol/L   Chloride 101 101 - 111 mmol/L   CO2 26 22 - 32 mmol/L   Glucose, Bld 96 65 - 99 mg/dL   BUN 8 6 - 20 mg/dL   Creatinine, Ser 1.00 0.61 - 1.24 mg/dL   Calcium 8.5 (L) 8.9 - 10.3 mg/dL   Total Protein 7.8 6.5 - 8.1 g/dL   Albumin 3.4 (L) 3.5 - 5.0 g/dL   AST 16 15 - 41 U/L   ALT 15 (L) 17 - 63 U/L   Alkaline Phosphatase 59 38 - 126 U/L   Total Bilirubin 0.3 0.3 - 1.2 mg/dL   GFR calc non Af Amer >60 >60 mL/min   GFR calc Af Amer >60 >60 mL/min    Comment: (NOTE) The eGFR has been calculated using  the CKD EPI equation. This calculation has not been validated in all clinical situations. eGFR's persistently <60 mL/min signify possible Chronic Kidney Disease.    Anion gap 10 5 - 15    Comment: Performed at [facility], [address] [place], [place] [address]  CBC with Differential     Status: Abnormal   Collection Time: 05/09/17 11:25 AM  Result Value Ref Range   WBC 11.1 (H) 4.0 - 10.5 K/uL   RBC 4.68 4.22 - 5.81 MIL/uL   Hemoglobin 12.8 (L) 13.0 - 17.0 g/dL   HCT 37.7 (L) 39.0 - 52.0 %   MCV 80.6 78.0 - 100.0 fL   MCH 27.4 26.0 - 34.0 pg   MCHC 34.0 30.0 - 36.0 g/dL   RDW 13.8 11.5 - 15.5 %   Platelets 431 (H) 150 - 400 K/uL   Neutrophils Relative % 76 %   Lymphocytes Relative 14 %   Monocytes Relative 9 %   Eosinophils Relative 1 %   Basophils Relative 0 %   Neutro Abs 8.4 (H) 1.7 - 7.7 K/uL   Lymphs Abs 1.6 0.7 - 4.0 K/uL   Monocytes Absolute 1.0 0.1 - 1.0 K/uL   Eosinophils Absolute  0.1 0.0 - 0.7 K/uL   Basophils Absolute 0.0 0.0 - 0.1 K/uL   WBC Morphology ATYPICAL LYMPHOCYTES     Comment: Performed at [facility], [address] [place], [place] [address]   Ct Abdomen Pelvis W Contrast  Result Date: 05/09/2017 CLINICAL DATA:  LEFT abdominal and lower abdominal pain with constipation since Saturday, similar symptoms on 04/25/2017, diverticulitis suspected, history of diverticulosis EXAM: CT ABDOMEN AND PELVIS WITH CONTRAST TECHNIQUE: Multidetector CT imaging of the abdomen and pelvis was performed using the standard protocol following bolus administration of intravenous contrast. Sagittal and coronal MPR images reconstructed from axial data set. CONTRAST:  169m ISOVUE-300 IOPAMIDOL (ISOVUE-300) INJECTION 61% IV. Dilute oral contrast. COMPARISON:  09/16/2015 FINDINGS: Lower chest: Lung bases clear Hepatobiliary: Tiny cysts RIGHT lobe liver. Gallbladder and liver otherwise normal appearance. Pancreas: Normal appearance Spleen: Normal appearance Adrenals/Urinary Tract: Adrenal thickening with a discrete 2.1 x 1.8 cm diameter LEFT adrenal mass image 26 grossly stable, demonstrating significant washout on delayed images consistent with adrenal adenoma. Kidneys and ureters normal appearance. Mild thickening of the LEFT superior bladder wall by sigmoid process, see below. Stomach/Bowel: Appendix not visualized but no definite with pericecal inflammatory process seen. Short segment of jejuno-jejunal intussusception in the LEFT mid abdomen image 53, approximately 3 cm length without obstruction. Significant wall thickening of the sigmoid colon with extensive pericolic infiltrative changes and note of scattered colonic diverticula, favoring acute diverticulitis over colitis. Adjacent extraluminal collection containing fluid and gas measuring 4.4 x 2.9 x 3.0 cm with consistent with diverticular abscess. Diffuse infiltration in sigmoid mesocolon. Edema of fat planes extending into  perirectal space. Thickening of a few small bowel loops in the RIGHT pelvis. Contrast had not yet reached the sigmoid colon at time of imaging to assess for extravasation. Stomach and remaining bowel loops unremarkable. Vascular/Lymphatic: Minimal atherosclerotic calcification distal aorta and iliac arteries. Aorta normal caliber. No adenopathy. Scattered normal size retroperitoneal nodes. Reproductive: Mild prostatic enlargement, gland 4.4 x 4.0 cm image 81. Seminal vesicles unremarkable. Other: Minimal ascites.  No free air.  No hernia. Musculoskeletal: Osseous structures unremarkable. IMPRESSION: Extensive inflammatory process of the sigmoid colon with colonic wall thickening and pericolic infiltrative changes most likely representing acute diverticulitis. Adjacent diverticular abscess 4.4 x  2.9 x 3.0 cm containing fluid and air. Short segment of jejuno-jejunal intussusception in the LEFT mid abdomen without evidence of bowel obstruction; uncertain if this represents a fixed or transient intussusception. Associated thickening of bladder wall and multiple small bowel loops in pelvis. Stable LEFT adrenal adenoma. Mild prostatic enlargement. Electronically Signed   By: Lavonia Dana M.D.   On: 05/09/2017 13:24    Pending Labs Unresulted Labs (From admission, onward)   Start     Ordered   05/16/17 0500  Creatinine, serum  (enoxaparin (LOVENOX)    CrCl >/= 30 ml/min)  Weekly,   R    Comments:  while on enoxaparin therapy    05/09/17 1602   05/10/17 0500  CBC  Daily,   R    Comments:  If Hgb <8, D/C heparin & other anticoagulation    05/09/17 1602   05/10/17 0500  Comprehensive metabolic panel  Tomorrow morning,   R     05/09/17 1647   05/09/17 1559  HIV antibody (Routine Testing)  Once,   R     05/09/17 1602      Vitals/Pain Today's Vitals   05/09/17 1247 05/09/17 1439 05/09/17 1539 05/09/17 1928  BP: (!) 147/94 (!) 164/94    Pulse: 68 75    Resp: 16 16    Temp:      TempSrc:      SpO2: 100%  100% 99%   Weight:      Height:      PainSc:  1   7     Isolation Precautions No active isolations  Medications Medications  enoxaparin (LOVENOX) injection 40 mg (not administered)  lactated ringers infusion (not administered)  acetaminophen (TYLENOL) tablet 650 mg (not administered)    Or  acetaminophen (TYLENOL) suppository 650 mg (not administered)  diphenhydrAMINE (BENADRYL) 12.5 MG/5ML elixir 12.5 mg (not administered)    Or  diphenhydrAMINE (BENADRYL) injection 12.5 mg (not administered)  ondansetron (ZOFRAN-ODT) disintegrating tablet 4 mg (not administered)    Or  ondansetron (ZOFRAN) injection 4 mg (not administered)  simethicone (MYLICON) chewable tablet 40 mg (not administered)  piperacillin-tazobactam (ZOSYN) IVPB 3.375 g (not administered)  lactated ringers bolus 1,000 mL (not administered)  methocarbamol (ROBAXIN) 1,000 mg in dextrose 5 % 50 mL IVPB (not administered)  prochlorperazine (COMPAZINE) injection 5-10 mg (not administered)  metoCLOPramide (REGLAN) injection 10 mg (not administered)  magic mouthwash (not administered)  guaiFENesin-dextromethorphan (ROBITUSSIN DM) 100-10 MG/5ML syrup 10 mL (not administered)  hydrocortisone (ANUSOL-HC) 2.5 % rectal cream 1 application (not administered)  alum & mag hydroxide-simeth (MAALOX/MYLANTA) 200-200-20 MG/5ML suspension 30 mL (not administered)  hydrocortisone cream 1 % 1 application (not administered)  menthol-cetylpyridinium (CEPACOL) lozenge 3 mg (not administered)  phenol (CHLORASEPTIC) mouth spray 1-2 spray (not administered)  HYDROmorphone (DILAUDID) injection 0.5 mg (0.5 mg Intravenous Given 05/09/17 1941)  LORazepam (ATIVAN) tablet 1 mg (not administered)    Or  LORazepam (ATIVAN) injection 1 mg (not administered)  thiamine (VITAMIN B-1) tablet 100 mg (not administered)    Or  thiamine (B-1) injection 100 mg (not administered)  folic acid (FOLVITE) tablet 1 mg (not administered)  multivitamin with minerals  tablet 1 tablet (not administered)  sodium chloride 0.9 % bolus 500 mL (0 mLs Intravenous Stopped 05/09/17 1215)  morphine 4 MG/ML injection 4 mg (4 mg Intravenous Given 05/09/17 1143)  ondansetron (ZOFRAN) injection 4 mg (4 mg Intravenous Given 05/09/17 1142)  iopamidol (ISOVUE-300) 61 % injection 100 mL (100 mLs Intravenous Contrast Given 05/09/17 1256)  piperacillin-tazobactam (ZOSYN) IVPB 3.375 g (0 g Intravenous Stopped 05/09/17 1422)  metoCLOPramide (REGLAN) injection 10 mg (10 mg Intravenous Given 05/09/17 1356)  morphine 4 MG/ML injection 4 mg (4 mg Intravenous Given 05/09/17 1506)  lactated ringers bolus 1,000 mL (1,000 mLs Intravenous New Bag/Given 05/09/17 1831)    Mobility walks

## 2017-05-09 NOTE — ED Triage Notes (Signed)
LLQ pain x4 days.  Reports hx of diverticulitis that "usually clears up itself."  This time it is not getting better.  Sts he feels like he is constipated although he had BMs yesterday and today.

## 2017-05-09 NOTE — ED Provider Notes (Signed)
Care assumed upon pt transfer from [facility], where he was seen by Dr. Nanda Quinton, please see their notes for full documentation of patient's complaint/HPI. Briefly, pt here with several days of worsening LLQ pain. Results so far show CMP essentially unremarkable, CBC with mild anemia Hgb 12.8/Hct 37.7, mildly elevated plt 431, and mildly elevated WBC 94.7 (neutrophilic predominance); U/A WNL; CT abd/pelv confirming inflammation in sigmoid colon with colonic wall thickening and pericolic infiltrative changes likely representing acute diverticulitis, with adjacent abscess 4.4x2.9x3.0c, containing fluid and air, as well as jejuno-jejunal intussusception in left mid abdomen without obstruction. Pt was transferred here for surgical consultation. He was given zosyn, reglan, morphine 4mg  x2, zofran 4mg , and 567mL bolus of fluids prior to transfer.    Additional history obtained during eval: pt ate a banana around 9am and had some water about 2hrs ago, but otherwise has been NPO today. Has had 2 prior inguinal hernia repairs in the 1990s, no other abd surgeries otherwise. No PMHx aside from diverticulosis. Does not currently have a PCP.   Physical Exam  BP (!) 164/94 (BP Location: Left Arm)   Pulse 75   Temp 98.3 F (36.8 C) (Oral)   Resp 16   Ht 5\' 10"  (1.778 m)   Wt 77.1 kg (170 lb)   SpO2 100%   BMI 24.39 kg/m   Physical Exam Gen: afebrile, VSS, NAD HEENT: EOMI, MMM Resp: CTAB in all lung fields, no w/r/r, no hypoxia or increased WOB, speaking in full sentences, SpO2 99% on RA  CV: RRR, nl s1/s2, no m/r/g  Abd: no definite distension, slightly rigid, slightly hypoactive bowel sounds throughout, with moderate TTP in LLQ and slight voluntary guarding, no rebound, neg murphy's, neg mcburney's, no CVA TTP MsK: moving all extremities with ease Neuro: A&O x4  ED Course/Procedures    Results for orders placed or performed during the hospital encounter of 05/09/17  Urinalysis, Routine w reflex  microscopic  Result Value Ref Range   Color, Urine YELLOW YELLOW   APPearance CLEAR CLEAR   Specific Gravity, Urine 1.010 1.005 - 1.030   pH 7.5 5.0 - 8.0   Glucose, UA NEGATIVE NEGATIVE mg/dL   Hgb urine dipstick NEGATIVE NEGATIVE   Bilirubin Urine NEGATIVE NEGATIVE   Ketones, ur NEGATIVE NEGATIVE mg/dL   Protein, ur NEGATIVE NEGATIVE mg/dL   Nitrite NEGATIVE NEGATIVE   Leukocytes, UA NEGATIVE NEGATIVE  Comprehensive metabolic panel  Result Value Ref Range   Sodium 137 135 - 145 mmol/L   Potassium 3.6 3.5 - 5.1 mmol/L   Chloride 101 101 - 111 mmol/L   CO2 26 22 - 32 mmol/L   Glucose, Bld 96 65 - 99 mg/dL   BUN 8 6 - 20 mg/dL   Creatinine, Ser 1.00 0.61 - 1.24 mg/dL   Calcium 8.5 (L) 8.9 - 10.3 mg/dL   Total Protein 7.8 6.5 - 8.1 g/dL   Albumin 3.4 (L) 3.5 - 5.0 g/dL   AST 16 15 - 41 U/L   ALT 15 (L) 17 - 63 U/L   Alkaline Phosphatase 59 38 - 126 U/L   Total Bilirubin 0.3 0.3 - 1.2 mg/dL   GFR calc non Af Amer >60 >60 mL/min   GFR calc Af Amer >60 >60 mL/min   Anion gap 10 5 - 15  CBC with Differential  Result Value Ref Range   WBC 11.1 (H) 4.0 - 10.5 K/uL   RBC 4.68 4.22 - 5.81 MIL/uL   Hemoglobin 12.8 (L) 13.0 - 17.0 g/dL  HCT 37.7 (L) 39.0 - 52.0 %   MCV 80.6 78.0 - 100.0 fL   MCH 27.4 26.0 - 34.0 pg   MCHC 34.0 30.0 - 36.0 g/dL   RDW 13.8 11.5 - 15.5 %   Platelets 431 (H) 150 - 400 K/uL   Neutrophils Relative % 76 %   Lymphocytes Relative 14 %   Monocytes Relative 9 %   Eosinophils Relative 1 %   Basophils Relative 0 %   Neutro Abs 8.4 (H) 1.7 - 7.7 K/uL   Lymphs Abs 1.6 0.7 - 4.0 K/uL   Monocytes Absolute 1.0 0.1 - 1.0 K/uL   Eosinophils Absolute 0.1 0.0 - 0.7 K/uL   Basophils Absolute 0.0 0.0 - 0.1 K/uL   WBC Morphology ATYPICAL LYMPHOCYTES    Ct Abdomen Pelvis W Contrast  Result Date: 05/09/2017 CLINICAL DATA:  LEFT abdominal and lower abdominal pain with constipation since Saturday, similar symptoms on 04/25/2017, diverticulitis suspected, history of  diverticulosis EXAM: CT ABDOMEN AND PELVIS WITH CONTRAST TECHNIQUE: Multidetector CT imaging of the abdomen and pelvis was performed using the standard protocol following bolus administration of intravenous contrast. Sagittal and coronal MPR images reconstructed from axial data set. CONTRAST:  157mL ISOVUE-300 IOPAMIDOL (ISOVUE-300) INJECTION 61% IV. Dilute oral contrast. COMPARISON:  09/16/2015 FINDINGS: Lower chest: Lung bases clear Hepatobiliary: Tiny cysts RIGHT lobe liver. Gallbladder and liver otherwise normal appearance. Pancreas: Normal appearance Spleen: Normal appearance Adrenals/Urinary Tract: Adrenal thickening with a discrete 2.1 x 1.8 cm diameter LEFT adrenal mass image 26 grossly stable, demonstrating significant washout on delayed images consistent with adrenal adenoma. Kidneys and ureters normal appearance. Mild thickening of the LEFT superior bladder wall by sigmoid process, see below. Stomach/Bowel: Appendix not visualized but no definite with pericecal inflammatory process seen. Short segment of jejuno-jejunal intussusception in the LEFT mid abdomen image 53, approximately 3 cm length without obstruction. Significant wall thickening of the sigmoid colon with extensive pericolic infiltrative changes and note of scattered colonic diverticula, favoring acute diverticulitis over colitis. Adjacent extraluminal collection containing fluid and gas measuring 4.4 x 2.9 x 3.0 cm with consistent with diverticular abscess. Diffuse infiltration in sigmoid mesocolon. Edema of fat planes extending into perirectal space. Thickening of a few small bowel loops in the RIGHT pelvis. Contrast had not yet reached the sigmoid colon at time of imaging to assess for extravasation. Stomach and remaining bowel loops unremarkable. Vascular/Lymphatic: Minimal atherosclerotic calcification distal aorta and iliac arteries. Aorta normal caliber. No adenopathy. Scattered normal size retroperitoneal nodes. Reproductive: Mild  prostatic enlargement, gland 4.4 x 4.0 cm image 81. Seminal vesicles unremarkable. Other: Minimal ascites.  No free air.  No hernia. Musculoskeletal: Osseous structures unremarkable. IMPRESSION: Extensive inflammatory process of the sigmoid colon with colonic wall thickening and pericolic infiltrative changes most likely representing acute diverticulitis. Adjacent diverticular abscess 4.4 x 2.9 x 3.0 cm containing fluid and air. Short segment of jejuno-jejunal intussusception in the LEFT mid abdomen without evidence of bowel obstruction; uncertain if this represents a fixed or transient intussusception. Associated thickening of bladder wall and multiple small bowel loops in pelvis. Stable LEFT adrenal adenoma. Mild prostatic enlargement. Electronically Signed   By: Lavonia Dana M.D.   On: 05/09/2017 13:24     Meds ordered this encounter  Medications  . sodium chloride 0.9 % bolus 500 mL  . morphine 4 MG/ML injection 4 mg  . ondansetron (ZOFRAN) injection 4 mg  . iopamidol (ISOVUE-300) 61 % injection 100 mL  . piperacillin-tazobactam (ZOSYN) IVPB 3.375 g  Order Specific Question:   Antibiotic Indication:    Answer:   Intra-abdominal Infection  . metoCLOPramide (REGLAN) injection 10 mg  . morphine 4 MG/ML injection 4 mg     MDM:   ICD-10-CM   1. Diverticulitis K57.92   2. Intra-abdominal abscess ([place]) K65.1   3. Left lower quadrant pain R10.32   4. Neutrophilic leukocytosis H60.7   5. Thrombocytosis ([facility]) D47.3   6. Anemia, unspecified type D64.9     3:51 PM Pt arrived and evaluated. Will consult surgery. He declines needing anything for pain/nausea/etc at this time.   3:56 PM Maximiano Coss of [facility] returning page, will come down and evaluate patient. Will stand by for further information regarding admission.   4:20 PM Dr. Johney Maine has placed admit orders and holding orders, appears to be admitting patient. Please see their notes for further documentation of care. I appreciate their help  with this pleasant pt's care. Pt stable at time of admission.    [address] [place], [place], [place] 05/09/17 1621    Virgel Manifold, MD 05/10/17 [phone]

## 2017-05-09 NOTE — ED Notes (Signed)
ED Provider at bedside. 

## 2017-05-09 NOTE — H&P (Signed)
[facility] Admission Note  Daniel Mcmillan 1970-07-28  [id number].    Requesting MD: Long Chief Complaint/Reason for Consult: diverticulitis with abscess HPI:  Patient is a 47 y/o male who presents to [facility] from [facility] with cramping abdominal pain x4 days. Pain is intermittent across lower abdomen. Sometimes radiates across abdomen with gas. Patient does not note anything in particular that worsens pain, notes some improvement with defecation or passing flatus. Patient reports that he has been more constipated recently but had a small, loose BM this AM. Occasionally has bloody stools if he has multiple bowel movements in a day. Patient denies fever, chills, chest pain, SOB, nausea, vomiting, urinary symptoms. He has not taken any antibiotics in the last month. He has had pain similar to this in the past and has been treated as an outpatient for diverticulitis in the past. He has not had a colonoscopy. He is otherwise healthy and does not take any blood thinning medications. He has had bilateral inguinal hernia repairs (open). He is not allergic to any medications. He drinks 4-5 beers per day and uses marijuana. He denies current or past tobacco use. 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   ROS: Review of Systems  Constitutional: Negative for chills and fever.  Respiratory: Negative for shortness of breath.   Cardiovascular: Negative for chest pain and palpitations.  Gastrointestinal: Positive for abdominal pain, blood in stool and constipation. Negative for diarrhea, melena, nausea and vomiting.  Genitourinary: Negative for dysuria, frequency and urgency.  All other systems reviewed and are negative.   No family history on file.  Past Medical History:  Diagnosis Date  . Diverticulosis     Past Surgical History:  Procedure Laterality Date  . HERNIA REPAIR      Social History:  reports that  has never smoked. he has never used smokeless tobacco. He reports that he drinks about 3.0 oz  of alcohol per week. He reports that he uses drugs. Drug: Marijuana.  Allergies: No Known Allergies   (Not in a hospital admission)  Blood pressure (!) 164/94, pulse 75, temperature 98.3 F (36.8 C), temperature source Oral, resp. rate 16, height 5' 10" (1.778 m), weight 77.1 kg (170 lb), SpO2 99 %. Physical Exam: Physical Exam  Constitutional: He is oriented to person, place, and time. He appears well-developed and well-nourished. He is cooperative.  Non-toxic appearance. No distress.  HENT:  Head: Normocephalic and atraumatic.  Mouth/Throat: Oropharynx is clear and moist and mucous membranes are normal.  Eyes: EOM and lids are normal. Pupils are equal, round, and reactive to light.  Neck: Normal range of motion and phonation normal. Neck supple.  Cardiovascular: Normal rate, regular rhythm and intact distal pulses.  Pulmonary/Chest: Effort normal and breath sounds normal.  Abdominal: Soft. Normal appearance. He exhibits distension (mild). He exhibits no shifting dullness and no mass. Bowel sounds are increased. There is no hepatosplenomegaly. There is no tenderness. There is guarding. There is no rigidity and no rebound. No hernia.  Bilateral scars from open inguinal hernia repairs  Musculoskeletal:  ROM grossly intact in BL upper and lower extremities  Neurological: He is alert and oriented to person, place, and time. He has normal strength. No sensory deficit.  Skin: Skin is warm, dry and intact.  Psychiatric: He has a normal mood and affect. His speech is normal and behavior is normal.    Results for orders placed or performed during the hospital encounter of 05/09/17 (from the past 48 hour(s))  Urinalysis, Routine w  reflex microscopic     Status: None   Collection Time: 05/09/17 11:15 AM  Result Value Ref Range   Color, Urine YELLOW YELLOW   APPearance CLEAR CLEAR   Specific Gravity, Urine 1.010 1.005 - 1.030   pH 7.5 5.0 - 8.0   Glucose, UA NEGATIVE NEGATIVE mg/dL   Hgb  urine dipstick NEGATIVE NEGATIVE   Bilirubin Urine NEGATIVE NEGATIVE   Ketones, ur NEGATIVE NEGATIVE mg/dL   Protein, ur NEGATIVE NEGATIVE mg/dL   Nitrite NEGATIVE NEGATIVE   Leukocytes, UA NEGATIVE NEGATIVE    Comment: Microscopic not done on urines with negative protein, blood, leukocytes, nitrite, or glucose < 500 mg/dL. Performed at [facility], [address] [place], [place] [address]   Comprehensive metabolic panel     Status: Abnormal   Collection Time: 05/09/17 11:25 AM  Result Value Ref Range   Sodium 137 135 - 145 mmol/L   Potassium 3.6 3.5 - 5.1 mmol/L   Chloride 101 101 - 111 mmol/L   CO2 26 22 - 32 mmol/L   Glucose, Bld 96 65 - 99 mg/dL   BUN 8 6 - 20 mg/dL   Creatinine, Ser 1.00 0.61 - 1.24 mg/dL   Calcium 8.5 (L) 8.9 - 10.3 mg/dL   Total Protein 7.8 6.5 - 8.1 g/dL   Albumin 3.4 (L) 3.5 - 5.0 g/dL   AST 16 15 - 41 U/L   ALT 15 (L) 17 - 63 U/L   Alkaline Phosphatase 59 38 - 126 U/L   Total Bilirubin 0.3 0.3 - 1.2 mg/dL   GFR calc non Af Amer >60 >60 mL/min   GFR calc Af Amer >60 >60 mL/min    Comment: (NOTE) The eGFR has been calculated using the CKD EPI equation. This calculation has not been validated in all clinical situations. eGFR's persistently <60 mL/min signify possible Chronic Kidney Disease.    Anion gap 10 5 - 15    Comment: Performed at [facility], [address] [place], [place] [address]  CBC with Differential     Status: Abnormal   Collection Time: 05/09/17 11:25 AM  Result Value Ref Range   WBC 11.1 (H) 4.0 - 10.5 K/uL   RBC 4.68 4.22 - 5.81 MIL/uL   Hemoglobin 12.8 (L) 13.0 - 17.0 g/dL   HCT 37.7 (L) 39.0 - 52.0 %   MCV 80.6 78.0 - 100.0 fL   MCH 27.4 26.0 - 34.0 pg   MCHC 34.0 30.0 - 36.0 g/dL   RDW 13.8 11.5 - 15.5 %   Platelets 431 (H) 150 - 400 K/uL   Neutrophils Relative % 76 %   Lymphocytes Relative 14 %   Monocytes Relative 9 %   Eosinophils Relative 1 %   Basophils Relative 0 %   Neutro Abs 8.4 (H)  1.7 - 7.7 K/uL   Lymphs Abs 1.6 0.7 - 4.0 K/uL   Monocytes Absolute 1.0 0.1 - 1.0 K/uL   Eosinophils Absolute 0.1 0.0 - 0.7 K/uL   Basophils Absolute 0.0 0.0 - 0.1 K/uL   WBC Morphology ATYPICAL LYMPHOCYTES     Comment: Performed at [facility], [address] [place], [place] [address]   Ct Abdomen Pelvis W Contrast  Result Date: 05/09/2017 CLINICAL DATA:  LEFT abdominal and lower abdominal pain with constipation since Saturday, similar symptoms on 04/25/2017, diverticulitis suspected, history of diverticulosis EXAM: CT ABDOMEN AND PELVIS WITH CONTRAST TECHNIQUE: Multidetector CT imaging of the abdomen and pelvis  was performed using the standard protocol following bolus administration of intravenous contrast. Sagittal and coronal MPR images reconstructed from axial data set. CONTRAST:  162m ISOVUE-300 IOPAMIDOL (ISOVUE-300) INJECTION 61% IV. Dilute oral contrast. COMPARISON:  09/16/2015 FINDINGS: Lower chest: Lung bases clear Hepatobiliary: Tiny cysts RIGHT lobe liver. Gallbladder and liver otherwise normal appearance. Pancreas: Normal appearance Spleen: Normal appearance Adrenals/Urinary Tract: Adrenal thickening with a discrete 2.1 x 1.8 cm diameter LEFT adrenal mass image 26 grossly stable, demonstrating significant washout on delayed images consistent with adrenal adenoma. Kidneys and ureters normal appearance. Mild thickening of the LEFT superior bladder wall by sigmoid process, see below. Stomach/Bowel: Appendix not visualized but no definite with pericecal inflammatory process seen. Short segment of jejuno-jejunal intussusception in the LEFT mid abdomen image 53, approximately 3 cm length without obstruction. Significant wall thickening of the sigmoid colon with extensive pericolic infiltrative changes and note of scattered colonic diverticula, favoring acute diverticulitis over colitis. Adjacent extraluminal collection containing fluid and gas measuring 4.4 x 2.9 x 3.0 cm with  consistent with diverticular abscess. Diffuse infiltration in sigmoid mesocolon. Edema of fat planes extending into perirectal space. Thickening of a few small bowel loops in the RIGHT pelvis. Contrast had not yet reached the sigmoid colon at time of imaging to assess for extravasation. Stomach and remaining bowel loops unremarkable. Vascular/Lymphatic: Minimal atherosclerotic calcification distal aorta and iliac arteries. Aorta normal caliber. No adenopathy. Scattered normal size retroperitoneal nodes. Reproductive: Mild prostatic enlargement, gland 4.4 x 4.0 cm image 81. Seminal vesicles unremarkable. Other: Minimal ascites.  No free air.  No hernia. Musculoskeletal: Osseous structures unremarkable. IMPRESSION: Extensive inflammatory process of the sigmoid colon with colonic wall thickening and pericolic infiltrative changes most likely representing acute diverticulitis. Adjacent diverticular abscess 4.4 x 2.9 x 3.0 cm containing fluid and air. Short segment of jejuno-jejunal intussusception in the LEFT mid abdomen without evidence of bowel obstruction; uncertain if this represents a fixed or transient intussusception. Associated thickening of bladder wall and multiple small bowel loops in pelvis. Stable LEFT adrenal adenoma. Mild prostatic enlargement. Electronically Signed   By: MLavonia DanaM.D.   On: 05/09/2017 13:24      Assessment/Plan Diverticulitis with abscess - CT: diverticular abscess 4.4 x 2.9 x 3.0 cm containing fluid and air - WBC 11.1, afebrile, non-toxic - have consulted IR for possible percutaneous drainage - if IR unable to drain, will treat with IV abx and repeat CT scan in a few days - discussed that if we are unable to resolve this with conservative management, would mean Hartmann's procedure  FEN: NPO w/ ice chips, IVF VTE: SCDs, lovenox ID: IV [id number] 2/7>>   KBrigid Re PDes[facility] Center[facility] 05/09/2017, 4:25 PM Pager: [phone] Consults: [phone]-Fri  7:00 am-4:30 pm Sat-Sun 7:00 am-11:30 am

## 2017-05-10 LAB — COMPREHENSIVE METABOLIC PANEL
ALT: 13 U/L — ABNORMAL LOW (ref 17–63)
ANION GAP: 10 (ref 5–15)
AST: 15 U/L (ref 15–41)
Albumin: 3.2 g/dL — ABNORMAL LOW (ref 3.5–5.0)
Alkaline Phosphatase: 57 U/L (ref 38–126)
BUN: 7 mg/dL (ref 6–20)
CALCIUM: 8.6 mg/dL — AB (ref 8.9–10.3)
CHLORIDE: 102 mmol/L (ref 101–111)
CO2: 23 mmol/L (ref 22–32)
Creatinine, Ser: 1.03 mg/dL (ref 0.61–1.24)
GFR calc Af Amer: >60 mL/min (ref >60)
Glucose, Bld: 86 mg/dL (ref 65–99)
Potassium: 3.4 mmol/L — ABNORMAL LOW (ref 3.5–5.1)
SODIUM: 135 mmol/L (ref 135–145)
Total Bilirubin: 0.4 mg/dL (ref 0.3–1.2)
Total Protein: 7.4 g/dL (ref 6.5–8.1)

## 2017-05-10 LAB — HIV ANTIBODY (ROUTINE TESTING W REFLEX): HIV SCREEN 4TH GENERATION: NONREACTIVE

## 2017-05-10 LAB — CBC
HCT: 38.9 % — ABNORMAL LOW (ref 39.0–52.0)
Hemoglobin: 12.8 g/dL — ABNORMAL LOW (ref 13.0–17.0)
MCH: 26.8 pg (ref 26.0–34.0)
MCHC: 32.9 g/dL (ref 30.0–36.0)
MCV: 81.6 fL (ref 78.0–100.0)
PLATELETS: 410 10*3/uL — AB (ref 150–400)
RBC: 4.77 MIL/uL (ref 4.22–5.81)
RDW: 13.8 % (ref 11.5–15.5)
WBC: 11.1 10*3/uL — ABNORMAL HIGH (ref 4.0–10.5)

## 2017-05-10 MED ORDER — HYDROMORPHONE HCL 1 MG/ML IJ SOLN
1.0000 mg | INTRAMUSCULAR | Status: DC | PRN
  Administered 2017-05-10 – 2017-05-13 (×20): 1 mg via INTRAVENOUS
  Filled 2017-05-10 (×21): qty 1

## 2017-05-10 MED ORDER — ALUM & MAG HYDROXIDE-SIMETH 200-200-20 MG/5ML PO SUSP: 30.0000 mL | Freq: Four times a day (QID) | ORAL | Status: DC | PRN

## 2017-05-10 MED ORDER — POTASSIUM CHLORIDE 10 MEQ/100ML IV SOLN
10.0000 meq | INTRAVENOUS | Status: AC
  Administered 2017-05-10 (×4): 10 meq via INTRAVENOUS
  Filled 2017-05-10 (×4): qty 100

## 2017-05-10 NOTE — Plan of Care (Signed)
  Completed/Met Elimination: Will not experience complications related to bowel motility 05/10/2017 0613 - Completed/Met by Lolita Rieger, RN Will not experience complications related to urinary retention 05/10/2017 2909 - Completed/Met by Lolita Rieger, RN Safety: Ability to remain free from injury will improve 05/10/2017 0613 - Completed/Met by Lolita Rieger, RN Skin Integrity: Risk for impaired skin integrity will decrease 05/10/2017 0613 - Completed/Met by Lolita Rieger, RN

## 2017-05-10 NOTE — Progress Notes (Signed)
[facility] Progress Note     Subjective: CC: abdominal pain Patient reports pain still present in lower abdomen, dilaudid helps. Denies nausea, vomiting. No fever, chills. Has had several non-bloody stools. VSS.   Objective: Vital signs in last 24 hours: Temp:  [97.4 F (36.3 C)-98.3 F (36.8 C)] 97.5 F (36.4 C) (02/08 1015) Pulse Rate:  [64-90] 64 (02/08 1015) Resp:  [16-20] 19 (02/08 1015) BP: (143-164)/(86-94) 148/92 (02/08 1015) SpO2:  [97 %-100 %] 98 % (02/08 1015) Weight:  [77.1 kg (170 lb)] 77.1 kg (170 lb) (02/07 1041) Last BM Date: 05/09/17  Intake/Output from previous day: 02/07 0701 - 02/08 0700 In: 1545 [I.V.:835; IV Piggyback:710] Out: 300 [Urine:300] Intake/Output this shift: No intake/output data recorded.  PE: Gen:  Alert, NAD, pleasant Card:  Regular rate and rhythm, pedal pulses 2+ BL Pulm:  Normal effort, clear to auscultation bilaterally Abd: Soft, TTP in suprapubic region, non-distended, bowel sounds present, no HSM Skin: warm and dry, no rashes  Psych: A&Ox3   Lab Results:  Recent Labs    05/09/17 1125 05/10/17 0635  WBC 11.1* 11.1*  HGB 12.8* 12.8*  HCT 37.7* 38.9*  PLT 431* 410*   BMET Recent Labs    05/09/17 1125 05/10/17 0635  NA 137 135  K 3.6 3.4*  CL 101 102  CO2 26 23  GLUCOSE 96 86  BUN 8 7  CREATININE 1.00 1.03  CALCIUM 8.5* 8.6*   PT/INR No results for input(s): LABPROT, INR in the last 72 hours. CMP     Component Value Date/Time   NA 135 05/10/2017 0635   K 3.4 (L) 05/10/2017 0635   CL 102 05/10/2017 0635   CO2 23 05/10/2017 0635   GLUCOSE 86 05/10/2017 0635   BUN 7 05/10/2017 0635   CREATININE 1.03 05/10/2017 0635   CALCIUM 8.6 (L) 05/10/2017 0635   PROT 7.4 05/10/2017 0635   ALBUMIN 3.2 (L) 05/10/2017 0635   AST 15 05/10/2017 0635   ALT 13 (L) 05/10/2017 0635   ALKPHOS 57 05/10/2017 0635   BILITOT 0.4 05/10/2017 0635   GFRNONAA >60 05/10/2017 0635   GFRAA >60 05/10/2017 0635   Lipase      Component Value Date/Time   LIPASE 14 09/16/2015 1234       Studies/Results: Ct Abdomen Pelvis W Contrast  Result Date: 05/09/2017 CLINICAL DATA:  LEFT abdominal and lower abdominal pain with constipation since Saturday, similar symptoms on 04/25/2017, diverticulitis suspected, history of diverticulosis EXAM: CT ABDOMEN AND PELVIS WITH CONTRAST TECHNIQUE: Multidetector CT imaging of the abdomen and pelvis was performed using the standard protocol following bolus administration of intravenous contrast. Sagittal and coronal MPR images reconstructed from axial data set. CONTRAST:  155mL ISOVUE-300 IOPAMIDOL (ISOVUE-300) INJECTION 61% IV. Dilute oral contrast. COMPARISON:  09/16/2015 FINDINGS: Lower chest: Lung bases clear Hepatobiliary: Tiny cysts RIGHT lobe liver. Gallbladder and liver otherwise normal appearance. Pancreas: Normal appearance Spleen: Normal appearance Adrenals/Urinary Tract: Adrenal thickening with a discrete 2.1 x 1.8 cm diameter LEFT adrenal mass image 26 grossly stable, demonstrating significant washout on delayed images consistent with adrenal adenoma. Kidneys and ureters normal appearance. Mild thickening of the LEFT superior bladder wall by sigmoid process, see below. Stomach/Bowel: Appendix not visualized but no definite with pericecal inflammatory process seen. Short segment of jejuno-jejunal intussusception in the LEFT mid abdomen image 53, approximately 3 cm length without obstruction. Significant wall thickening of the sigmoid colon with extensive pericolic infiltrative changes and note of scattered colonic diverticula, favoring acute diverticulitis over  colitis. Adjacent extraluminal collection containing fluid and gas measuring 4.4 x 2.9 x 3.0 cm with consistent with diverticular abscess. Diffuse infiltration in sigmoid mesocolon. Edema of fat planes extending into perirectal space. Thickening of a few small bowel loops in the RIGHT pelvis. Contrast had not yet reached the  sigmoid colon at time of imaging to assess for extravasation. Stomach and remaining bowel loops unremarkable. Vascular/Lymphatic: Minimal atherosclerotic calcification distal aorta and iliac arteries. Aorta normal caliber. No adenopathy. Scattered normal size retroperitoneal nodes. Reproductive: Mild prostatic enlargement, gland 4.4 x 4.0 cm image 81. Seminal vesicles unremarkable. Other: Minimal ascites.  No free air.  No hernia. Musculoskeletal: Osseous structures unremarkable. IMPRESSION: Extensive inflammatory process of the sigmoid colon with colonic wall thickening and pericolic infiltrative changes most likely representing acute diverticulitis. Adjacent diverticular abscess 4.4 x 2.9 x 3.0 cm containing fluid and air. Short segment of jejuno-jejunal intussusception in the LEFT mid abdomen without evidence of bowel obstruction; uncertain if this represents a fixed or transient intussusception. Associated thickening of bladder wall and multiple small bowel loops in pelvis. Stable LEFT adrenal adenoma. Mild prostatic enlargement. Electronically Signed   By: Lavonia Dana M.D.   On: 05/09/2017 13:24    Anti-infectives: Anti-infectives (From admission, onward)   Start     Dose/Rate Route Frequency Ordered Stop   05/09/17 2000  piperacillin-tazobactam (ZOSYN) IVPB 3.375 g     3.375 g 12.5 mL/hr over 240 Minutes Intravenous Every 8 hours 05/09/17 1602     05/09/17 1345  piperacillin-tazobactam (ZOSYN) IVPB 3.375 g     3.375 g 100 mL/hr over 30 Minutes Intravenous  Once 05/09/17 1336 05/09/17 1422       Assessment/Plan Diverticulitis with abscess - CT: diverticular abscess 4.4 x 2.9 x 3.0 cm containing fluid and air - WBC 11.1, afebrile, non-toxic - IR feels no window to safely drain abscess, will tx with IV abx and repeat CT scan in a few days - patient still tender on exam, keep on ice chips only until pain improves - discussed that if we are unable to resolve this with conservative management,  would mean Hartmann's procedure Alcohol abuse - patient drinks 4-5 beers daily, CIWA protocol  Hypokalemia - 3.4  FEN: NPO w/ ice chips, IVF; replace K  VTE: SCDs, lovenox ID: IV zosyn 2/7>>    LOS: 1 day    Brigid Re , [facility] 05/10/2017, 10:26 AM Pager: [phone] Consults: [phone] Mon-Fri 7:00 am-4:30 pm Sat-Sun 7:00 am-11:30 am

## 2017-05-11 LAB — BASIC METABOLIC PANEL
ANION GAP: 11 (ref 5–15)
BUN: 7 mg/dL (ref 6–20)
CHLORIDE: 102 mmol/L (ref 101–111)
CO2: 23 mmol/L (ref 22–32)
Calcium: 8.6 mg/dL — ABNORMAL LOW (ref 8.9–10.3)
Creatinine, Ser: 1.01 mg/dL (ref 0.61–1.24)
GFR calc Af Amer: >60 mL/min (ref >60)
GFR calc non Af Amer: >60 mL/min (ref >60)
GLUCOSE: 76 mg/dL (ref 65–99)
POTASSIUM: 3.6 mmol/L (ref 3.5–5.1)
Sodium: 136 mmol/L (ref 135–145)

## 2017-05-11 LAB — CBC
HCT: 36.2 % — ABNORMAL LOW (ref 39.0–52.0)
HEMOGLOBIN: 12.1 g/dL — AB (ref 13.0–17.0)
MCH: 27.1 pg (ref 26.0–34.0)
MCHC: 33.4 g/dL (ref 30.0–36.0)
MCV: 81 fL (ref 78.0–100.0)
Platelets: 453 10*3/uL — ABNORMAL HIGH (ref 150–400)
RBC: 4.47 MIL/uL (ref 4.22–5.81)
RDW: 13.9 % (ref 11.5–15.5)
WBC: 13.3 10*3/uL — AB (ref 4.0–10.5)

## 2017-05-11 NOTE — Progress Notes (Signed)
Subjective/Chief Complaint: Feels better. Having bm's and flatus   Objective: Vital signs in last 24 hours: Temp:  [97.5 F (36.4 C)-98 F (36.7 C)] 97.9 F (36.6 C) (02/09 0340) Pulse Rate:  [59-67] 67 (02/09 0340) Resp:  [12-19] 14 (02/09 0340) BP: (148-170)/(79-92) 170/88 (02/09 0340) SpO2:  [98 %-100 %] 99 % (02/09 0340) Last BM Date: 05/10/17  Intake/Output from previous day: 02/08 0701 - 02/09 0700 In: 4268 [P.O.:240; I.V.:1010; IV Piggyback:400] Out: 3 [Urine:3] Intake/Output this shift: No intake/output data recorded.  General appearance: alert and cooperative Resp: clear to auscultation bilaterally Cardio: regular rate and rhythm GI: soft, minimal tenderness LLQ  Lab Results:  Recent Labs    05/10/17 0635 05/11/17 0626  WBC 11.1* 13.3*  HGB 12.8* 12.1*  HCT 38.9* 36.2*  PLT 410* 453*   BMET Recent Labs    05/10/17 0635 05/11/17 0626  NA 135 136  K 3.4* 3.6  CL 102 102  CO2 23 23  GLUCOSE 86 76  BUN 7 7  CREATININE 1.03 1.01  CALCIUM 8.6* 8.6*   PT/INR No results for input(s): LABPROT, INR in the last 72 hours. ABG No results for input(s): PHART, HCO3 in the last 72 hours.  Invalid input(s): PCO2, PO2  Studies/Results: Ct Abdomen Pelvis W Contrast  Result Date: 05/09/2017 CLINICAL DATA:  LEFT abdominal and lower abdominal pain with constipation since Saturday, similar symptoms on 04/25/2017, diverticulitis suspected, history of diverticulosis EXAM: CT ABDOMEN AND PELVIS WITH CONTRAST TECHNIQUE: Multidetector CT imaging of the abdomen and pelvis was performed using the standard protocol following bolus administration of intravenous contrast. Sagittal and coronal MPR images reconstructed from axial data set. CONTRAST:  146mL ISOVUE-300 IOPAMIDOL (ISOVUE-300) INJECTION 61% IV. Dilute oral contrast. COMPARISON:  09/16/2015 FINDINGS: Lower chest: Lung bases clear Hepatobiliary: Tiny cysts RIGHT lobe liver. Gallbladder and liver otherwise normal  appearance. Pancreas: Normal appearance Spleen: Normal appearance Adrenals/Urinary Tract: Adrenal thickening with a discrete 2.1 x 1.8 cm diameter LEFT adrenal mass image 26 grossly stable, demonstrating significant washout on delayed images consistent with adrenal adenoma. Kidneys and ureters normal appearance. Mild thickening of the LEFT superior bladder wall by sigmoid process, see below. Stomach/Bowel: Appendix not visualized but no definite with pericecal inflammatory process seen. Short segment of jejuno-jejunal intussusception in the LEFT mid abdomen image 53, approximately 3 cm length without obstruction. Significant wall thickening of the sigmoid colon with extensive pericolic infiltrative changes and note of scattered colonic diverticula, favoring acute diverticulitis over colitis. Adjacent extraluminal collection containing fluid and gas measuring 4.4 x 2.9 x 3.0 cm with consistent with diverticular abscess. Diffuse infiltration in sigmoid mesocolon. Edema of fat planes extending into perirectal space. Thickening of a few small bowel loops in the RIGHT pelvis. Contrast had not yet reached the sigmoid colon at time of imaging to assess for extravasation. Stomach and remaining bowel loops unremarkable. Vascular/Lymphatic: Minimal atherosclerotic calcification distal aorta and iliac arteries. Aorta normal caliber. No adenopathy. Scattered normal size retroperitoneal nodes. Reproductive: Mild prostatic enlargement, gland 4.4 x 4.0 cm image 81. Seminal vesicles unremarkable. Other: Minimal ascites.  No free air.  No hernia. Musculoskeletal: Osseous structures unremarkable. IMPRESSION: Extensive inflammatory process of the sigmoid colon with colonic wall thickening and pericolic infiltrative changes most likely representing acute diverticulitis. Adjacent diverticular abscess 4.4 x 2.9 x 3.0 cm containing fluid and air. Short segment of jejuno-jejunal intussusception in the LEFT mid abdomen without evidence of  bowel obstruction; uncertain if this represents a fixed or transient intussusception. Associated thickening of  bladder wall and multiple small bowel loops in pelvis. Stable LEFT adrenal adenoma. Mild prostatic enlargement. Electronically Signed   By: Lavonia Dana M.D.   On: 05/09/2017 13:24    Anti-infectives: Anti-infectives (From admission, onward)   Start     Dose/Rate Route Frequency Ordered Stop   05/09/17 2000  piperacillin-tazobactam (ZOSYN) IVPB 3.375 g     3.375 g 12.5 mL/hr over 240 Minutes Intravenous Every 8 hours 05/09/17 1602     05/09/17 1345  piperacillin-tazobactam (ZOSYN) IVPB 3.375 g     3.375 g 100 mL/hr over 30 Minutes Intravenous  Once 05/09/17 1336 05/09/17 1422      Assessment/Plan: s/p * No surgery found * Advance diet. Start clears today Continue IV zosyn Ambulate CT next week  LOS: 2 days    TOTH III,PAUL S 05/11/2017

## 2017-05-12 LAB — CBC
HCT: 33.6 % — ABNORMAL LOW (ref 39.0–52.0)
Hemoglobin: 11.5 g/dL — ABNORMAL LOW (ref 13.0–17.0)
MCH: 27.4 pg (ref 26.0–34.0)
MCHC: 34.2 g/dL (ref 30.0–36.0)
MCV: 80 fL (ref 78.0–100.0)
PLATELETS: 397 10*3/uL (ref 150–400)
RBC: 4.2 MIL/uL — AB (ref 4.22–5.81)
RDW: 13.8 % (ref 11.5–15.5)
WBC: 11.2 10*3/uL — AB (ref 4.0–10.5)

## 2017-05-12 MED ORDER — METOPROLOL TARTRATE 5 MG/5ML IV SOLN
5.0000 mg | Freq: Four times a day (QID) | INTRAVENOUS | Status: DC | PRN
  Administered 2017-05-13 – 2017-05-16 (×3): 5 mg via INTRAVENOUS
  Filled 2017-05-12 (×3): qty 5

## 2017-05-12 NOTE — Progress Notes (Signed)
   Subjective/Chief Complaint: No complaints. Feels better. Tolerated clears   Objective: Vital signs in last 24 hours: Temp:  [98.4 F (36.9 C)-98.6 F (37 C)] 98.6 F (37 C) (02/09 2007) Pulse Rate:  [63-69] 63 (02/10 0151) Resp:  [20] 20 (02/09 1409) BP: (153-168)/(92-101) 154/95 (02/10 0151) SpO2:  [98 %-100 %] 98 % (02/10 0151) Last BM Date: 05/11/17  Intake/Output from previous day: 02/09 0701 - 02/10 0700 In: 2140 [P.O.:840; I.V.:1200; IV Piggyback:100] Out: -  Intake/Output this shift: No intake/output data recorded.  General appearance: alert and cooperative Resp: clear to auscultation bilaterally Cardio: regular rate and rhythm GI: soft, minimal tenderness  Lab Results:  Recent Labs    05/11/17 0626 05/12/17 0550  WBC 13.3* 11.2*  HGB 12.1* 11.5*  HCT 36.2* 33.6*  PLT 453* 397   BMET Recent Labs    05/10/17 0635 05/11/17 0626  NA 135 136  K 3.4* 3.6  CL 102 102  CO2 23 23  GLUCOSE 86 76  BUN 7 7  CREATININE 1.03 1.01  CALCIUM 8.6* 8.6*   PT/INR No results for input(s): LABPROT, INR in the last 72 hours. ABG No results for input(s): PHART, HCO3 in the last 72 hours.  Invalid input(s): PCO2, PO2  Studies/Results: No results found.  Anti-infectives: Anti-infectives (From admission, onward)   Start     Dose/Rate Route Frequency Ordered Stop   05/09/17 2000  piperacillin-tazobactam (ZOSYN) IVPB 3.375 g     3.375 g 12.5 mL/hr over 240 Minutes Intravenous Every 8 hours 05/09/17 1602     05/09/17 1345  piperacillin-tazobactam (ZOSYN) IVPB 3.375 g     3.375 g 100 mL/hr over 30 Minutes Intravenous  Once 05/09/17 1336 05/09/17 1422      Assessment/Plan: s/p * No surgery found * Advance diet. Start fulls today Continue IV zosyn Ambulate Diverticulitis with abscess. CT early this week  LOS: 3 days    TOTH III,PAUL S 05/12/2017

## 2017-05-13 ENCOUNTER — Inpatient Hospital Stay (HOSPITAL_COMMUNITY): Payer: Self-pay

## 2017-05-13 ENCOUNTER — Encounter (HOSPITAL_COMMUNITY): Payer: Self-pay | Admitting: Radiology

## 2017-05-13 LAB — CBC
HCT: 33.2 % — ABNORMAL LOW (ref 39.0–52.0)
Hemoglobin: 11.1 g/dL — ABNORMAL LOW (ref 13.0–17.0)
MCH: 26.9 pg (ref 26.0–34.0)
MCHC: 33.4 g/dL (ref 30.0–36.0)
MCV: 80.4 fL (ref 78.0–100.0)
PLATELETS: 409 10*3/uL — AB (ref 150–400)
RBC: 4.13 MIL/uL — ABNORMAL LOW (ref 4.22–5.81)
RDW: 13.8 % (ref 11.5–15.5)
WBC: 8.7 10*3/uL (ref 4.0–10.5)

## 2017-05-13 MED ORDER — HYDROMORPHONE HCL 1 MG/ML IJ SOLN
1.0000 mg | INTRAMUSCULAR | Status: DC | PRN
  Administered 2017-05-13 – 2017-05-14 (×4): 1 mg via INTRAVENOUS
  Filled 2017-05-13 (×4): qty 1

## 2017-05-13 MED ORDER — ENOXAPARIN SODIUM 40 MG/0.4ML ~~LOC~~ SOLN: 40.0000 mg | SUBCUTANEOUS | Status: DC

## 2017-05-13 MED ORDER — IOPAMIDOL (ISOVUE-300) INJECTION 61%: 15.0000 mL | Freq: Once | INTRAVENOUS | Status: DC | PRN

## 2017-05-13 MED ORDER — SODIUM CHLORIDE 0.9 % IJ SOLN
INTRAMUSCULAR | Status: AC
  Filled 2017-05-13: qty 50

## 2017-05-13 MED ORDER — IOPAMIDOL (ISOVUE-300) INJECTION 61%
INTRAVENOUS | Status: AC
  Filled 2017-05-13: qty 100

## 2017-05-13 MED ORDER — IOPAMIDOL (ISOVUE-300) INJECTION 61%
100.0000 mL | Freq: Once | INTRAVENOUS | Status: AC | PRN
  Administered 2017-05-13: 100 mL via INTRAVENOUS

## 2017-05-13 MED ORDER — OXYCODONE HCL 5 MG PO TABS
5.0000 mg | ORAL_TABLET | ORAL | Status: DC | PRN
  Administered 2017-05-13 (×2): 5 mg via ORAL
  Filled 2017-05-13 (×2): qty 1

## 2017-05-13 MED ORDER — IOPAMIDOL (ISOVUE-300) INJECTION 61%
INTRAVENOUS | Status: AC
  Administered 2017-05-13: 30 mL
  Filled 2017-05-13: qty 30

## 2017-05-13 NOTE — Progress Notes (Signed)
[facility] Progress Note     Subjective: CC-  OOB in chair doing work on laptop. States that he feels much better today. Feels like himself for the first time in 2 weeks. Denies abdominal pain. Tolerating full liquids. Denies n/v. WBC WNL today and patient afebrile. Planning repeat CT scan today.  Objective: Vital signs in last 24 hours: Temp:  [97.7 F (36.5 C)-98.1 F (36.7 C)] 97.7 F (36.5 C) (02/11 0227) Pulse Rate:  [52-72] 61 (02/11 0227) Resp:  [20] 20 (02/11 0227) BP: (128-166)/(84-102) 128/84 (02/11 0227) SpO2:  [100 %] 100 % (02/11 0227) Last BM Date: 05/12/17  Intake/Output from previous day: 02/10 0701 - 02/11 0700 In: 3100 [P.O.:700; I.V.:2250; IV Piggyback:150] Out: 250 [Urine:250] Intake/Output this shift: Total I/O In: -  Out: 600 [Urine:600]  PE: Gen:  Alert, NAD, pleasant HEENT: EOM's intact, pupils equal and round Card:  RRR, no M/G/R heard Pulm:  CTAB, no W/R/R, effort normal Abd: Soft, NT/ND, +BS, no HSM, no hernia Ext:  No erythema, edema, or tenderness BUE/BLE  Psych: A&Ox3  Skin: no rashes noted, warm and dry  Lab Results:  Recent Labs    05/12/17 0550 05/13/17 0604  WBC 11.2* 8.7  HGB 11.5* 11.1*  HCT 33.6* 33.2*  PLT 397 409*   BMET Recent Labs    05/11/17 0626  NA 136  K 3.6  CL 102  CO2 23  GLUCOSE 76  BUN 7  CREATININE 1.01  CALCIUM 8.6*   PT/INR No results for input(s): LABPROT, INR in the last 72 hours. CMP     Component Value Date/Time   NA 136 05/11/2017 0626   K 3.6 05/11/2017 0626   CL 102 05/11/2017 0626   CO2 23 05/11/2017 0626   GLUCOSE 76 05/11/2017 0626   BUN 7 05/11/2017 0626   CREATININE 1.01 05/11/2017 0626   CALCIUM 8.6 (L) 05/11/2017 0626   PROT 7.4 05/10/2017 0635   ALBUMIN 3.2 (L) 05/10/2017 0635   AST 15 05/10/2017 0635   ALT 13 (L) 05/10/2017 0635   ALKPHOS 57 05/10/2017 0635   BILITOT 0.4 05/10/2017 0635   GFRNONAA >60 05/11/2017 0626   GFRAA >60 05/11/2017 0626    Lipase     Component Value Date/Time   LIPASE 14 09/16/2015 1234       Studies/Results: No results found.  Anti-infectives: Anti-infectives (From admission, onward)   Start     Dose/Rate Route Frequency Ordered Stop   05/09/17 2000  piperacillin-tazobactam (ZOSYN) IVPB 3.375 g     3.375 g 12.5 mL/hr over 240 Minutes Intravenous Every 8 hours 05/09/17 1602     05/09/17 1345  piperacillin-tazobactam (ZOSYN) IVPB 3.375 g     3.375 g 100 mL/hr over 30 Minutes Intravenous  Once 05/09/17 1336 05/09/17 1422       Assessment/Plan Alcohol abuse - patient drinks 4-5 beers daily, CIWA protocol  Hypokalemia - resolved  Diverticulitis with abscess - CT 05/09/17:diverticular abscess 4.4 x 2.9 x 3.0 cm containing fluid and air; unable to drain in IR - WBC now 8.7, afebrile, VSS - no pain on exam and patient tolerating full liquids - Repeat CT scan pending this morning. Continue IV zosyn and full liquids for now. Will give further recommendations once CT scan performed.  FEN: IVF, full liquids VTE: SCDs, lovenox ID: IV zosyn 2/7>>day#5 Foley: none   LOS: 4 days    Wellington Hampshire , [facility] 05/13/2017, 9:39 AM Pager: [phone] Consults: [phone] Mon-Fri 7:00  am-4:30 pm Sat-Sun 7:00 am-11:30 am

## 2017-05-13 NOTE — Consult Note (Signed)
Chief Complaint: diverticular abscess  Referring Physician:Dr. Johnathan Hausen  Supervising Physician: Markus Daft  Patient Status: [facility] - In-pt  HPI: Daniel Mcmillan is a 47 y.o. male with no significant PMH who was admitted on 05-09-17 with diverticulitis.  His CT scan at the time showed an abscess, but it was not amenable to drainage at that time.  The patient has been symptomatically improving over the weekend on IV abx therapy; however, follow up CT scan today revealed a slightly large collection that now appears to be amenable to drainage.  IR has been asked to evaluate for possible drainage.  Past Medical History:  Past Medical History:  Diagnosis Date  . Diverticulosis     Past Surgical History:  Past Surgical History:  Procedure Laterality Date  . HERNIA REPAIR      Family History: History reviewed. No pertinent family history.  Social History:  reports that  has never smoked. he has never used smokeless tobacco. He reports that he drinks about 3.0 oz of alcohol per week. He reports that he uses drugs. Drug: Marijuana.  Allergies: No Known Allergies  Medications: Medications reviewed in epic  Please HPI for pertinent positives, otherwise complete 10 system ROS negative.  Mallampati Score: MD Evaluation Airway: WNL Heart: WNL Abdomen: WNL Chest/ Lungs: WNL ASA  Classification: 2 Mallampati/Airway Score: One  Physical Exam: BP (!) 176/101 (BP Location: Left Arm) Comment: rn notified  Pulse (!) 59   Temp 97.6 F (36.4 C) (Oral)   Resp 20   Ht 5\' 10"  (1.778 m)   Wt 170 lb (77.1 kg)   SpO2 100%   BMI 24.39 kg/m  Body mass index is 24.39 kg/m. General: pleasant, WD, WN black male who is sitting in a chair in NAD HEENT: head is normocephalic, atraumatic.  Sclera are noninjected.  PERRL.  Ears and nose without any masses or lesions.  Mouth is pink and moist Heart: regular, rate, and rhythm.  Normal s1,s2. No obvious murmurs, gallops, or rubs noted.  Palpable radial  pulses bilaterally Lungs: CTAB, no wheezes, rhonchi, or rales noted.  Respiratory effort nonlabored Abd: soft, NT, ND, +BS, no masses, hernias, or organomegaly Psych: A&Ox3 with an appropriate affect.   Labs: Results for orders placed or performed during the hospital encounter of 05/09/17 (from the past 48 hour(s))  CBC     Status: Abnormal   Collection Time: 05/12/17  5:50 AM  Result Value Ref Range   WBC 11.2 (H) 4.0 - 10.5 K/uL   RBC 4.20 (L) 4.22 - 5.81 MIL/uL   Hemoglobin 11.5 (L) 13.0 - 17.0 g/dL   HCT 33.6 (L) 39.0 - 52.0 %   MCV 80.0 78.0 - 100.0 fL   MCH 27.4 26.0 - 34.0 pg   MCHC 34.2 30.0 - 36.0 g/dL   RDW 13.8 11.5 - 15.5 %   Platelets 397 150 - 400 K/uL    Comment: Performed at [facility], [place] [address], [place], [place] [address]  CBC     Status: Abnormal   Collection Time: 05/13/17  6:04 AM  Result Value Ref Range   WBC 8.7 4.0 - 10.5 K/uL   RBC 4.13 (L) 4.22 - 5.81 MIL/uL   Hemoglobin 11.1 (L) 13.0 - 17.0 g/dL   HCT 33.2 (L) 39.0 - 52.0 %   MCV 80.4 78.0 - 100.0 fL   MCH 26.9 26.0 - 34.0 pg   MCHC 33.4 30.0 - 36.0 g/dL   RDW 13.8 11.5 - 15.5 %  Platelets 409 (H) 150 - 400 K/uL    Comment: Performed at [facility], [place] [address] [place],  [address]    Imaging: Ct Abdomen Pelvis W Contrast  Result Date: 05/13/2017 CLINICAL DATA:  Followup diverticulitis on antibiotic therapy. EXAM: CT ABDOMEN AND PELVIS WITH CONTRAST TECHNIQUE: Multidetector CT imaging of the abdomen and pelvis was performed using the standard protocol following bolus administration of intravenous contrast. CONTRAST:  164mL ISOVUE-300 IOPAMIDOL (ISOVUE-300) INJECTION 61% COMPARISON:  CT 05/09/2017 and 09/16/2015. FINDINGS: Lower chest: Mild linear atelectasis at the right lung base. The lung bases are otherwise clear. No significant pleural or pericardial effusion. Hepatobiliary: Stable low-density hepatic lesions on images 13 and 20, likely  cysts. No worrisome hepatic findings. No evidence of gallstones, gallbladder wall thickening or biliary dilatation. Pancreas: Unremarkable. No pancreatic ductal dilatation or surrounding inflammatory changes. Spleen: Normal in size without focal abnormality. Adrenals/Urinary Tract: Stable 2.1 x 1.7 cm left adrenal nodule, measuring 11 HU on the delayed post-contrast images, consistent with an adenoma. The right adrenal gland appears normal. The kidneys appear normal without evidence mass, calculus or hydronephrosis. There is mildly progressive superior bladder wall thickening. There is no air in the bladder lumen. Stomach/Bowel: The stomach and small bowel appear normal. No residual jejunal intussusception seen. There are diverticular changes throughout the distal colon. Extensive sigmoid colon wall thickening and surrounding inflammation are again noted consistent with acute diverticulitis. Peri diverticular abscess in the central false pelvis has mildly enlarged, measuring up to 6.1 x 3.6 cm on image 58. This contains fluid and air. The contrast has just passed into this region of the sigmoid colon and does not enter this collection. There are additional small fluid collections between the sigmoid colon and bladder, most obvious on the reformatted images and measuring up to 2.3 cm on coronal image 52. No evidence of bowel obstruction. Vascular/Lymphatic: There are no enlarged abdominal or pelvic lymph nodes. Minimal aortoiliac atherosclerosis. No acute vascular findings. Reproductive: Stable mild enlargement of the prostate gland. The seminal vesicles appear normal. Other: Mild generalized soft tissue edema. No free intraperitoneal air or extravasated enteric contrast. There is a small amount of free pelvic fluid. There is a small umbilical hernia containing only fat. Musculoskeletal: No acute or significant osseous findings. IMPRESSION: 1. Persistent changes of sigmoid diverticulosis with enlarging surrounding  extraluminal air and fluid collections consistent with diverticular abscesses. No evidence of bowel obstruction, free air or contrast extravasation. 2. Mild generalized soft tissue edema. Stable minimal pelvic ascites. 3. Mildly progressive bladder wall thickening without evidence of colovesical fistula. 4. Stable left adrenal adenoma. Electronically Signed   By: Richardean Sale M.D.   On: 05/13/2017 11:39    Assessment/Plan 1. Diverticulitis with abscess  The patient has been eating and drinking today.  Plan to proceed with drain placement tomorrow after he has been NPO p MN.  Lovenox will be held tonight.  Check PT/INR in am. Risks and benefits discussed with the patient including bleeding, infection, damage to adjacent structures, bowel perforation/fistula connection, and sepsis.  All of the patient's questions were answered, patient is agreeable to proceed. Consent signed and in chart.   Thank you for this interesting consult.  I greatly enjoyed meeting Daniel Mcmillan and look forward to participating in their care.  A copy of this report was sent to the requesting provider on this date.  Electronically Signed: Henreitta Cea 05/13/2017, 3:16 PM   I spent a total of 40 Minutes    in  face to face in clinical consultation, greater than 50% of which was counseling/coordinating care for diverticular abscess

## 2017-05-13 NOTE — Care Management Note (Signed)
Case Management Note  Patient Details  Name: Daniel Mcmillan MRN: [id number] Date of Birth: [birth year]  Subjective/Objective:                  Diverticular abcess  Action/Plan: Date:  May 13, 2017 Chart reviewed for concurrent status and case management needs.  Will continue to follow patient progress.  Discharge Planning: following for needs.  None present at this time of review. Expected discharge date: May 16, 2017 Velva Harman, BSN, [place], [place]   Expected Discharge Date:                  Expected Discharge Plan:  Home/Self Care  In-House Referral:     Discharge planning Services  CM Consult  Post Acute Care Choice:    Choice offered to:     DME Arranged:    DME Agency:     HH Arranged:    HH Agency:     Status of Service:  In process, will continue to follow  If discussed at Long Length of Stay Meetings, dates discussed:    Additional Comments:  Leeroy Cha, RN 05/13/2017, 9:50 AM

## 2017-05-13 NOTE — Progress Notes (Signed)
Patient refused PRN lopressor for high blood pressure.

## 2017-05-13 NOTE — Progress Notes (Signed)
Patient's BP is elevated; patient refused the PRN Lopressor. Patient stated he doesn't take any blood pressure medication at home. He is feeling anxious due to being at the hospital and does not want to take any blood pressure medication. PA paged.

## 2017-05-14 ENCOUNTER — Encounter (HOSPITAL_COMMUNITY): Payer: Self-pay | Admitting: Radiology

## 2017-05-14 ENCOUNTER — Inpatient Hospital Stay (HOSPITAL_COMMUNITY): Payer: Self-pay

## 2017-05-14 LAB — CBC
HEMATOCRIT: 33.1 % — AB (ref 39.0–52.0)
Hemoglobin: 10.8 g/dL — ABNORMAL LOW (ref 13.0–17.0)
MCH: 26.4 pg (ref 26.0–34.0)
MCHC: 32.6 g/dL (ref 30.0–36.0)
MCV: 80.9 fL (ref 78.0–100.0)
Platelets: 407 10*3/uL — ABNORMAL HIGH (ref 150–400)
RBC: 4.09 MIL/uL — ABNORMAL LOW (ref 4.22–5.81)
RDW: 13.8 % (ref 11.5–15.5)
WBC: 7.4 10*3/uL (ref 4.0–10.5)

## 2017-05-14 LAB — PROTIME-INR
INR: 1.17
Prothrombin Time: 14.9 seconds (ref 11.4–15.2)

## 2017-05-14 MED ORDER — MIDAZOLAM HCL 2 MG/2ML IJ SOLN
INTRAMUSCULAR | Status: AC
  Filled 2017-05-14: qty 6

## 2017-05-14 MED ORDER — HYDROMORPHONE HCL 1 MG/ML IJ SOLN
0.5000 mg | INTRAMUSCULAR | Status: DC | PRN
  Administered 2017-05-14 – 2017-05-16 (×10): 1 mg via INTRAVENOUS
  Filled 2017-05-14 (×10): qty 1

## 2017-05-14 MED ORDER — LIDOCAINE HCL (PF) 1 % IJ SOLN
INTRAMUSCULAR | Status: AC | PRN
  Administered 2017-05-14: 10 mL via SUBCUTANEOUS

## 2017-05-14 MED ORDER — CODEINE SULFATE 30 MG PO TABS: 30.0000 mg | ORAL_TABLET | ORAL | Status: DC | PRN

## 2017-05-14 MED ORDER — ENOXAPARIN SODIUM 40 MG/0.4ML ~~LOC~~ SOLN
40.0000 mg | SUBCUTANEOUS | Status: DC
  Administered 2017-05-15: 40 mg via SUBCUTANEOUS
  Filled 2017-05-14 (×3): qty 0.4

## 2017-05-14 MED ORDER — METHOCARBAMOL 500 MG PO TABS
1000.0000 mg | ORAL_TABLET | Freq: Three times a day (TID) | ORAL | Status: DC
  Administered 2017-05-14 – 2017-05-16 (×6): 1000 mg via ORAL
  Filled 2017-05-14 (×6): qty 2

## 2017-05-14 MED ORDER — FENTANYL CITRATE (PF) 100 MCG/2ML IJ SOLN
INTRAMUSCULAR | Status: AC
Start: 2017-05-14 — End: 2017-05-14
  Filled 2017-05-14: qty 6

## 2017-05-14 MED ORDER — MIDAZOLAM HCL 2 MG/2ML IJ SOLN
INTRAMUSCULAR | Status: AC | PRN
  Administered 2017-05-14 (×2): 1 mg via INTRAVENOUS

## 2017-05-14 MED ORDER — NALOXONE HCL 0.4 MG/ML IJ SOLN
INTRAMUSCULAR | Status: AC
  Filled 2017-05-14: qty 1

## 2017-05-14 MED ORDER — LISINOPRIL 5 MG PO TABS
5.0000 mg | ORAL_TABLET | Freq: Every day | ORAL | Status: DC
  Administered 2017-05-14 – 2017-05-16 (×3): 5 mg via ORAL
  Filled 2017-05-14 (×3): qty 1

## 2017-05-14 MED ORDER — SODIUM CHLORIDE 0.9% FLUSH
10.0000 mL | Freq: Three times a day (TID) | INTRAVENOUS | Status: DC
  Administered 2017-05-14 – 2017-05-17 (×9): 10 mL via INTRAVENOUS

## 2017-05-14 MED ORDER — FLUMAZENIL 0.5 MG/5ML IV SOLN
INTRAVENOUS | Status: AC
  Filled 2017-05-14: qty 5

## 2017-05-14 MED ORDER — FENTANYL CITRATE (PF) 100 MCG/2ML IJ SOLN
INTRAMUSCULAR | Status: AC | PRN
  Administered 2017-05-14 (×2): 50 ug via INTRAVENOUS

## 2017-05-14 MED ORDER — ACETAMINOPHEN 500 MG PO TABS
1000.0000 mg | ORAL_TABLET | Freq: Three times a day (TID) | ORAL | Status: DC
  Administered 2017-05-14 – 2017-05-16 (×6): 1000 mg via ORAL
  Filled 2017-05-14 (×6): qty 2

## 2017-05-14 NOTE — Sedation Documentation (Signed)
Patient is resting comfortably. 

## 2017-05-14 NOTE — Sedation Documentation (Signed)
Patient denies pain and is resting comfortably.  

## 2017-05-14 NOTE — Sedation Documentation (Signed)
Patient is resting comfortably. NO S/S OF DISCOMFORT NOTED DURING LIDOCAINE INJECTION

## 2017-05-14 NOTE — Progress Notes (Signed)
    CC: Abdominal pain  Subjective: He says he feels good and no complaints of pain when I examined him this a.m.  Is also asked the nurse for some additional pain medicine if it is time.  Objective: Vital signs in last 24 hours: Temp:  [97.6 F (36.4 C)-98.2 F (36.8 C)] 98.2 F (36.8 C) (02/12 0559) Pulse Rate:  [51-63] 63 (02/12 0559) Resp:  [18] 18 (02/12 0559) BP: (157-180)/(93-101) 157/93 (02/12 0559) SpO2:  [98 %-100 %] 98 % (02/12 0559) Last BM Date: 05/12/17 200 PO 2280 IV 1000 urine BP up - not taking meds for it.   WBC still normal     Intake/Output from previous day: 02/11 0701 - 02/12 0700 In: 2530 [P.O.:200; I.V.:2280; IV Piggyback:50] Out: 1000 [Urine:1000] Intake/Output this shift: No intake/output data recorded.  General appearance: alert, cooperative and no distress Resp: clear to auscultation bilaterally GI: Soft, nontender on my exam.  No distention.  Lab Results:  Recent Labs    05/13/17 0604 05/14/17 0611  WBC 8.7 7.4  HGB 11.1* 10.8*  HCT 33.2* 33.1*  PLT 409* 407*    BMET No results for input(s): NA, K, CL, CO2, GLUCOSE, BUN, CREATININE, CALCIUM in the last 72 hours. PT/INR Recent Labs    05/14/17 0611  LABPROT 14.9  INR 1.17    Recent Labs  Lab 05/09/17 1125 05/10/17 0635  AST 16 15  ALT 15* 13*  ALKPHOS 59 57  BILITOT 0.3 0.4  PROT 7.8 7.4  ALBUMIN 3.4* 3.2*     Lipase     Component Value Date/Time   LIPASE 14 09/16/2015 1234     Medications: . enoxaparin (LOVENOX) injection  40 mg Subcutaneous Q24H  . folic acid  1 mg Oral Daily  . multivitamin with minerals  1 tablet Oral Daily  . thiamine  100 mg Oral Daily   Or  . thiamine  100 mg Intravenous Daily    Assessment/Plan Alcohol abuse- patient drinks 4-5 beers daily, CIWA protocol  Hypokalemia- resolved Hypertension - Add Lisinopril 5 mg today, decrease IV fluids; encouraged to take PRN's ordered for BP  Diverticulitis with abscess - CT  05/09/17:diverticular abscess 4.4 x 2.9 x 3.0 cm containing fluid and air; unable to drain in IR - WBC now 8.7, afebrile, VSS - no pain on exam and patient tolerating full liquids - Repeat CT scan 2/11: Peri diverticular abscess in the central false pelvis has mildly enlarged, measuring up to 6.1 x 3.6 cm - Plan IR drain today  FEN: IVF, full liquids VTE: SCDs, lovenox ID: IV zosyn 2/7>>day#6 Foley: none  Plan: He is scheduled for IR later today.  We will recheck all his labs again in the a.m. Add lisinopril to PO meds for BP.       LOS: 5 days    Cletis Clack 05/14/[id number]

## 2017-05-14 NOTE — Procedures (Signed)
Interventional Radiology Procedure Note  Procedure: CT guided percutaneous catheter drainage of peritoneal abscess  Complications: None  Estimated Blood Loss: < 10 mL  Findings: Diverticular abscess yielded purulent fluid.  12 Fr percutaneous drain placed and attached to suction bulb.  After drain placement, output also bloody. Will follow.  Venetia Night. Kathlene Cote, M.D Pager:  [phone]

## 2017-05-15 LAB — COMPREHENSIVE METABOLIC PANEL
ALT: 12 U/L — ABNORMAL LOW (ref 17–63)
ANION GAP: 10 (ref 5–15)
AST: 14 U/L — ABNORMAL LOW (ref 15–41)
Albumin: 2.9 g/dL — ABNORMAL LOW (ref 3.5–5.0)
Alkaline Phosphatase: 52 U/L (ref 38–126)
BUN: <5 mg/dL — ABNORMAL LOW (ref 6–20)
CALCIUM: 8.6 mg/dL — AB (ref 8.9–10.3)
CHLORIDE: 103 mmol/L (ref 101–111)
CO2: 23 mmol/L (ref 22–32)
Creatinine, Ser: 1.02 mg/dL (ref 0.61–1.24)
GFR calc Af Amer: >60 mL/min (ref >60)
GFR calc non Af Amer: >60 mL/min (ref >60)
Glucose, Bld: 93 mg/dL (ref 65–99)
POTASSIUM: 3.7 mmol/L (ref 3.5–5.1)
SODIUM: 136 mmol/L (ref 135–145)
Total Bilirubin: 0.6 mg/dL (ref 0.3–1.2)
Total Protein: 6.5 g/dL (ref 6.5–8.1)

## 2017-05-15 LAB — CBC
HCT: 30.8 % — ABNORMAL LOW (ref 39.0–52.0)
Hemoglobin: 10.5 g/dL — ABNORMAL LOW (ref 13.0–17.0)
MCH: 27.4 pg (ref 26.0–34.0)
MCHC: 34.1 g/dL (ref 30.0–36.0)
MCV: 80.4 fL (ref 78.0–100.0)
PLATELETS: 360 10*3/uL (ref 150–400)
RBC: 3.83 MIL/uL — ABNORMAL LOW (ref 4.22–5.81)
RDW: 14 % (ref 11.5–15.5)
WBC: 7.8 10*3/uL (ref 4.0–10.5)

## 2017-05-15 MED ORDER — IBUPROFEN 200 MG PO TABS
600.0000 mg | ORAL_TABLET | Freq: Four times a day (QID) | ORAL | Status: DC | PRN
  Administered 2017-05-15 – 2017-05-17 (×3): 600 mg via ORAL
  Filled 2017-05-15 (×3): qty 3

## 2017-05-15 NOTE — Progress Notes (Signed)
Referring Physician(s): Martin,M  Supervising Physician: Marybelle Killings  Patient Status:  [name]  Chief Complaint:  pelvic abscess  Subjective: Pt doing ok today; wants to eat/advance diet; has some soreness at abd drain site   Allergies: Patient has no known allergies.  Medications: Prior to Admission medications   Medication Sig Start Date End Date Taking? Authorizing Provider  acetaminophen (TYLENOL) 325 MG tablet Take 650 mg by mouth 2 (two) times daily as needed for moderate pain.   Yes [provider]  sodium chloride (OCEAN) 0.65 % SOLN nasal spray Place 1 spray into both nostrils as needed for congestion.   Yes [provider]     Vital Signs: BP (!) 149/82 (BP Location: Left Arm)   Pulse 64   Temp 97.7 F (36.5 C) (Oral)   Resp 16   Ht 5\' 10"  (1.778 m)   Wt 170 lb (77.1 kg)   SpO2 99%   BMI 24.39 kg/m   Physical Exam abd drain intact, dressing dry, site mildly tender; output 25 cc blood-tinged fluid  Imaging: Ct Abdomen Pelvis W Contrast  Result Date: 05/13/2017 CLINICAL DATA:  Followup diverticulitis on antibiotic therapy. EXAM: CT ABDOMEN AND PELVIS WITH CONTRAST TECHNIQUE: Multidetector CT imaging of the abdomen and pelvis was performed using the standard protocol following bolus administration of intravenous contrast. CONTRAST:  125mL ISOVUE-300 IOPAMIDOL (ISOVUE-300) INJECTION 61% COMPARISON:  CT 05/09/2017 and 09/16/2015. FINDINGS: Lower chest: Mild linear atelectasis at the right lung base. The lung bases are otherwise clear. No significant pleural or pericardial effusion. Hepatobiliary: Stable low-density hepatic lesions on images 13 and 20, likely cysts. No worrisome hepatic findings. No evidence of gallstones, gallbladder wall thickening or biliary dilatation. Pancreas: Unremarkable. No pancreatic ductal dilatation or surrounding inflammatory changes. Spleen: Normal in size without focal abnormality. Adrenals/Urinary Tract:  Stable 2.1 x 1.7 cm left adrenal nodule, measuring 11 HU on the delayed post-contrast images, consistent with an adenoma. The right adrenal gland appears normal. The kidneys appear normal without evidence mass, calculus or hydronephrosis. There is mildly progressive superior bladder wall thickening. There is no air in the bladder lumen. Stomach/Bowel: The stomach and small bowel appear normal. No residual jejunal intussusception seen. There are diverticular changes throughout the distal colon. Extensive sigmoid colon wall thickening and surrounding inflammation are again noted consistent with acute diverticulitis. Peri diverticular abscess in the central false pelvis has mildly enlarged, measuring up to 6.1 x 3.6 cm on image 58. This contains fluid and air. The contrast has just passed into this region of the sigmoid colon and does not enter this collection. There are additional small fluid collections between the sigmoid colon and bladder, most obvious on the reformatted images and measuring up to 2.3 cm on coronal image 52. No evidence of bowel obstruction. Vascular/Lymphatic: There are no enlarged abdominal or pelvic lymph nodes. Minimal aortoiliac atherosclerosis. No acute vascular findings. Reproductive: Stable mild enlargement of the prostate gland. The seminal vesicles appear normal. Other: Mild generalized soft tissue edema. No free intraperitoneal air or extravasated enteric contrast. There is a small amount of free pelvic fluid. There is a small umbilical hernia containing only fat. Musculoskeletal: No acute or significant osseous findings. IMPRESSION: 1. Persistent changes of sigmoid diverticulosis with enlarging surrounding extraluminal air and fluid collections consistent with diverticular abscesses. No evidence of bowel obstruction, free air or contrast extravasation. 2. Mild generalized soft tissue edema. Stable minimal pelvic ascites. 3. Mildly progressive bladder wall thickening without evidence of  colovesical fistula.  4. Stable left adrenal adenoma. Electronically Signed   By: Richardean Sale M.D.   On: 05/13/2017 11:39   Ct Image Guided Drainage By Percutaneous Catheter  Result Date: 05/14/2017 CLINICAL DATA:  Sigmoid colonic diverticulitis with associated diverticular abscess. EXAM: CT GUIDED CATHETER DRAINAGE OF PERITONEAL DIVERTICULAR ABSCESS ANESTHESIA/SEDATION: 2.0 mg IV Versed 100 mcg IV Fentanyl Total Moderate Sedation Time:  31 minutes The patient's level of consciousness and physiologic status were continuously monitored during the procedure by Radiology nursing. PROCEDURE: The procedure, risks, benefits, and alternatives were explained to the patient. Questions regarding the procedure were encouraged and answered. The patient understands and consents to the procedure. A time out was performed prior to initiating the procedure. Initial unenhanced CT was performed in a supine position through the lower abdomen and pelvis. The lower abdominal wall was prepped with chlorhexidine in a sterile fashion, and a sterile drape was applied covering the operative field. A sterile gown and sterile gloves were used for the procedure. Local anesthesia was provided with 1% Lidocaine. Under CT guidance, an 18 gauge trocar needle was advanced to the level of an anterior diverticular abscess to the left of midline in the peritoneal cavity. Aspiration was performed and a fluid sample sent for culture analysis. A guidewire was advanced. The tract was dilated to 38 [place]. A 12 French percutaneous drainage catheter was advanced over the guidewire. Catheter position was confirmed by CT after placement. The catheter was flushed and connected to a suction bulb. It was secured at the skin with a Prolene retention suture and StatLock device. COMPLICATIONS: None FINDINGS: Aspiration at the level of the diverticular abscess yielded grossly purulent fluid. After placement of the drain, there is return of purulent and bloody  fluid. IMPRESSION: Percutaneous catheter drainage under CT guidance of a sigmoid diverticular abscess. A 12 French drainage catheter was placed and attached to suction bulb drainage. Electronically Signed   By: Aletta Edouard M.D.   On: 05/14/2017 11:03    Labs:  CBC: Recent Labs    05/12/17 0550 05/13/17 0604 05/14/17 0611 05/15/17 0615  WBC 11.2* 8.7 7.4 7.8  HGB 11.5* 11.1* 10.8* 10.5*  HCT 33.6* 33.2* 33.1* 30.8*  PLT 397 409* 407* 360    COAGS: Recent Labs    05/14/17 0611  INR 1.17    BMP: Recent Labs    05/09/17 1125 05/10/17 0635 05/11/17 0626 05/15/17 0615  NA 137 135 136 136  K 3.6 3.4* 3.6 3.7  CL 101 102 102 103  CO2 26 23 23 23   GLUCOSE 96 86 76 93  BUN 8 7 7  <5*  CALCIUM 8.5* 8.6* 8.6* 8.6*  CREATININE 1.00 1.03 1.01 1.02  GFRNONAA >60 >60 >60 >60  GFRAA >60 >60 >60 >60    LIVER FUNCTION TESTS: Recent Labs    05/09/17 1125 05/10/17 0635 05/15/17 0615  BILITOT 0.3 0.4 0.6  AST 16 15 14*  ALT 15* 13* 12*  ALKPHOS 59 57 52  PROT 7.8 7.4 6.5  ALBUMIN 3.4* 3.2* 2.9*    Assessment and Plan: S/p drainage of sigmoid diverticular abscess 2/12; afebrile; WBC nl; hgb stable; creat nl; drain fluid cx pend; cont with drain irrigation; diet per CCS; check final cx/sens; check f/u CT within 1 week either as IP or at IR drain clinic .  Electronically Signed: D. Rowe Robert, PA-C 05/15/2017, 11:17 AM   I spent a total of 15 minutes at the the patient's bedside AND on the patient's hospital floor or unit, greater  than 50% of which was counseling/coordinating care for pelvic abscess drain    Patient ID: Daniel Mcmillan, male   DOB: 26-Jul-1970, 47 y.o.   MRN: [id number]

## 2017-05-15 NOTE — Progress Notes (Signed)
Patient instructed on bulb flushing, draining, and charging bulb.  Patient assisted drained & charged bulb while RN observed.

## 2017-05-15 NOTE — Progress Notes (Signed)
NT:ZGYFVCBSW pain  Subjective: Patient reports feeling pretty good this a.m.He says he slept better last night that he has the whole time he has been in the hospital.Drainage from the JP is a thick bloody appearing cloudy fluid.  He is not distended and not tender on palpation this a.m.  Objective: Vital signs in last 24 hours: Temp:  [97.7 F (36.5 C)-98.9 F (37.2 C)] 97.7 F (36.5 C) (02/13 0525) Pulse Rate:  [54-71] 64 (02/13 0525) Resp:  [10-24] 16 (02/13 0004) BP: (133-180)/(78-122) 149/82 (02/13 0525) SpO2:  [97 %-100 %] 99 % (02/13 0525) Last BM Date: 05/15/17 480 PO 975 IV Urine 300 Drain 50 ml Afebrile, VSS, BP up WBC remains normal 7.8 Labs OK Pain control:  Tylenol 1000 mg x 2 yesterday, 1 dose this AM also   Dilaudid 4 mg IV yesterday   Robaxin 1000 mg x 2  Intake/Output from previous day: 02/12 0701 - 02/13 0700 In: 1465 [P.O.:480; I.V.:825; IV Piggyback:150] Out: 350 [Urine:300; Drains:50] Intake/Output this shift: No intake/output data recorded.  General appearance: alert, cooperative and no distress Resp: clear to auscultation bilaterally GI: soft, non-tender; bowel sounds normal; no masses,  no organomegaly and When asked about his pain he can points to the mid abdomen/lower mid abdomen.  No focal tenderness he says it hurts mostly when he gets up to move.  Lab Results:  Recent Labs    05/14/17 0611 05/15/17 0615  WBC 7.4 7.8  HGB 10.8* 10.5*  HCT 33.1* 30.8*  PLT 407* 360    BMET Recent Labs    05/15/17 0615  NA 136  K 3.7  CL 103  CO2 23  GLUCOSE 93  BUN <5*  CREATININE 1.02  CALCIUM 8.6*   PT/INR Recent Labs    05/14/17 0611  LABPROT 14.9  INR 1.17    Recent Labs  Lab 05/09/17 1125 05/10/17 0635 05/15/17 0615  AST 16 15 14*  ALT 15* 13* 12*  ALKPHOS 59 57 52  BILITOT 0.3 0.4 0.6  PROT 7.8 7.4 6.5  ALBUMIN 3.4* 3.2* 2.9*     Lipase     Component Value Date/Time   LIPASE 14 09/16/2015 1234      Medications: . acetaminophen  1,000 mg Oral Q8H  . enoxaparin (LOVENOX) injection  40 mg Subcutaneous Q24H  . folic acid  1 mg Oral Daily  . lisinopril  5 mg Oral Daily  . methocarbamol  1,000 mg Oral TID  . multivitamin with minerals  1 tablet Oral Daily  . sodium chloride flush  10 mL Intravenous Q8H  . thiamine  100 mg Oral Daily   Or  . thiamine  100 mg Intravenous Daily   . lactated ringers 50 mL/hr at 05/14/17 1849  . methocarbamol (ROBAXIN)  IV Stopped (05/10/17 2217)  . piperacillin-tazobactam (ZOSYN)  IV 3.375 g (05/15/17 0525)   Anti-infectives (From admission, onward)   Start     Dose/Rate Route Frequency Ordered Stop   05/09/17 2000  piperacillin-tazobactam (ZOSYN) IVPB 3.375 g     3.375 g 12.5 mL/hr over 240 Minutes Intravenous Every 8 hours 05/09/17 1602     05/09/17 1345  piperacillin-tazobactam (ZOSYN) IVPB 3.375 g     3.375 g 100 mL/hr over 30 Minutes Intravenous  Once 05/09/17 1336 05/09/17 1422      Assessment/Plan Diverticulitis with abscess - CT2/7/19:diverticular abscess 4.4 x 2.9 x 3.0 cm containing fluid and air; unable to drain in IR - WBC now 7.8, afebrile, VSS -  no pain on exam and patient tolerating full liquids - Repeat CT scan 2/11: Peri diverticular abscess in the central false pelvis has mildly enlarged, measuring up to 6.1 x 3.6 cm -   IR drain Placed 05/14/17  Alcohol abuse- patient drinks 4-5 beers daily, CIWA protocol  Hypokalemia-resolved Hypertension - Add Lisinopril 5 mg today, decrease IV fluids; encouraged to take PRN's ordered for BP  FEN:IVF, full liquids VTE: SCDs, lovenox ID: IV zosyn 2/7>> day# 7 Foley: none  Plan: Continue IV antibiotics and drain.  We will discuss advancing diet.  Add NSAID for pain relief also.        LOS: 6 days    Jillyan Plitt 05/15/2017 [phone]

## 2017-05-16 LAB — CREATININE, SERUM
Creatinine, Ser: 1.08 mg/dL (ref 0.61–1.24)
GFR calc Af Amer: >60 mL/min (ref >60)

## 2017-05-16 MED ORDER — ACETAMINOPHEN-CODEINE #3 300-30 MG PO TABS
1.0000 | ORAL_TABLET | Freq: Four times a day (QID) | ORAL | Status: DC | PRN
  Administered 2017-05-16 – 2017-05-17 (×4): 2 via ORAL
  Filled 2017-05-16 (×5): qty 2

## 2017-05-16 MED ORDER — AMLODIPINE BESYLATE 5 MG PO TABS: 5.0000 mg | ORAL_TABLET | Freq: Two times a day (BID) | ORAL | Status: DC | PRN

## 2017-05-16 MED ORDER — ACETAMINOPHEN-CODEINE #3 300-30 MG PO TABS: 1.0000 | ORAL_TABLET | Freq: Four times a day (QID) | ORAL | Status: DC | PRN

## 2017-05-16 MED ORDER — LISINOPRIL 20 MG PO TABS
20.0000 mg | ORAL_TABLET | Freq: Every day | ORAL | Status: DC
  Administered 2017-05-17: 20 mg via ORAL
  Filled 2017-05-16: qty 1

## 2017-05-16 MED ORDER — SACCHAROMYCES BOULARDII 250 MG PO CAPS
250.0000 mg | ORAL_CAPSULE | Freq: Two times a day (BID) | ORAL | Status: DC
  Administered 2017-05-16 – 2017-05-17 (×2): 250 mg via ORAL
  Filled 2017-05-16 (×2): qty 1

## 2017-05-16 MED ORDER — AMOXICILLIN-POT CLAVULANATE 875-125 MG PO TABS
1.0000 | ORAL_TABLET | Freq: Two times a day (BID) | ORAL | Status: DC
  Administered 2017-05-16 – 2017-05-17 (×3): 1 via ORAL
  Filled 2017-05-16 (×3): qty 1

## 2017-05-16 MED ORDER — HYDROMORPHONE HCL 1 MG/ML IJ SOLN: 0.5000 mg | INTRAMUSCULAR | Status: DC | PRN

## 2017-05-16 MED ORDER — LISINOPRIL 5 MG PO TABS
15.0000 mg | ORAL_TABLET | Freq: Once | ORAL | Status: AC
  Administered 2017-05-16: 13:00:00 15 mg via ORAL
  Filled 2017-05-16: qty 1

## 2017-05-16 MED ORDER — SODIUM CHLORIDE 0.9% FLUSH
10.0000 mL | Freq: Two times a day (BID) | INTRAVENOUS | Status: DC
  Administered 2017-05-17: 10 mL via INTRAVENOUS

## 2017-05-16 MED ORDER — METHOCARBAMOL 500 MG PO TABS
750.0000 mg | ORAL_TABLET | Freq: Four times a day (QID) | ORAL | Status: DC | PRN
  Administered 2017-05-16: 750 mg via ORAL
  Filled 2017-05-16: qty 2

## 2017-05-16 MED ORDER — AMLODIPINE BESYLATE 5 MG PO TABS: 5.0000 mg | ORAL_TABLET | Freq: Three times a day (TID) | ORAL | Status: DC | PRN

## 2017-05-16 NOTE — Progress Notes (Signed)
CC: Abdominal pain  Subjective: He looks good he is up walking getting ready to eat a regular diet.  He never complains of pain when I am here or examining him.  JP drainage is serosanguineous.    Objective: Vital signs in last 24 hours: Temp:  [97.9 F (36.6 C)-98.6 F (37 C)] 98.1 F (36.7 C) (02/14 1104) Pulse Rate:  [53-66] 66 (02/14 1104) Resp:  [18] 18 (02/13 2149) BP: (154-179)/(87-98) 176/98 (02/14 1104) SpO2:  [100 %] 100 % (02/14 1104) Last BM Date: 05/15/17 1080 PO 2000 IV 650 urine recorded 45 drain Afebrile, VSS BP still up No labs this AM Pain: Tylenol 1gm x 3 Dilaudid x 5 yesterday and 2 this AM Robaxin 1 gram x 3 Motrin 600 mg x 1 No codiene    BP:  lisinopril 5 mg Lopressor x 1 5 mg   Intake/Output from previous day: 02/13 0701 - 02/14 0700 In: 3175.8 [P.O.:1080; I.V.:1915.8; IV Piggyback:150] Out: 695 [Urine:650; Drains:45] Intake/Output this shift: Total I/O In: 10 [Other:10] Out: 10 [Drains:10]  General appearance: alert, cooperative and no distress Resp: clear to auscultation bilaterally GI: Soft, he denies any tenderness or pain.  Drainage from the JP is serosanguineous.  Lab Results:  Recent Labs    05/14/17 0611 05/15/17 0615  WBC 7.4 7.8  HGB 10.8* 10.5*  HCT 33.1* 30.8*  PLT 407* 360    BMET Recent Labs    05/15/17 0615 05/16/17 0536  NA 136  --   K 3.7  --   CL 103  --   CO2 23  --   GLUCOSE 93  --   BUN <5*  --   CREATININE 1.02 1.08  CALCIUM 8.6*  --    PT/INR Recent Labs    05/14/17 0611  LABPROT 14.9  INR 1.17    Recent Labs  Lab 05/10/17 0635 05/15/17 0615  AST 15 14*  ALT 13* 12*  ALKPHOS 57 52  BILITOT 0.4 0.6  PROT 7.4 6.5  ALBUMIN 3.2* 2.9*     Lipase     Component Value Date/Time   LIPASE 14 09/16/2015 1234     Medications: . acetaminophen  1,000 mg Oral Q8H  . enoxaparin (LOVENOX) injection  40 mg Subcutaneous Q24H  . folic acid  1 mg Oral Daily  . lisinopril  5 mg  Oral Daily  . methocarbamol  1,000 mg Oral TID  . multivitamin with minerals  1 tablet Oral Daily  . sodium chloride flush  10 mL Intravenous Q8H  . thiamine  100 mg Oral Daily   Or  . thiamine  100 mg Intravenous Daily   . lactated ringers 50 mL/hr at 05/16/17 0651  . methocarbamol (ROBAXIN)  IV Stopped (05/10/17 2217)  . piperacillin-tazobactam (ZOSYN)  IV 3.375 g (05/16/17 0548)   Anti-infectives (From admission, onward)   Start     Dose/Rate Route Frequency Ordered Stop   05/09/17 2000  piperacillin-tazobactam (ZOSYN) IVPB 3.375 g     3.375 g 12.5 mL/hr over 240 Minutes Intravenous Every 8 hours 05/09/17 1602     05/09/17 1345  piperacillin-tazobactam (ZOSYN) IVPB 3.375 g     3.375 g 100 mL/hr over 30 Minutes Intravenous  Once 05/09/17 1336 05/09/17 1422      Assessment/Plan Diverticulitis with abscess - CT2/7/19:diverticular abscess 4.4 x 2.9 x 3.0 cm containing fluid and air; unable to drain in IR - WBC now 7.8, afebrile, VSS - no pain on exam and patient tolerating full  liquids - Repeat CT scan2/11:Peri diverticular abscess in the central false pelvis has mildly enlarged, measuring up to 6.1 x 3.6 cm-   IR drain Placed 05/14/17  Alcohol abuse- patient drinks 4-5 beers daily, CIWA protocol  Hypokalemia-resolved Hypertension -Add Lisinopril 5 mg today, decrease IV fluids; encouraged to take PRN's ordered for BP  FEN:IVF, advanced to regular diet this AM VTE: SCDs, lovenox ID: IV zosyn 2/7>> day# 8 Foley: none   Plan: Increase the ace inhibitor.  Stop the IV Dilaudid use.  Regular diet and switch to Augmentin today.  Recheck labs in a.m.  If he does well will aim to discharge home tomorrow on oral antibiotics.  Follow-up with Dr. Hassell Done and the drain clinic. Increase his lisinopril also, stop IV fluids.   LOS: 7 days    Aleaya Latona 05/16/2017 [phone]

## 2017-05-16 NOTE — Progress Notes (Signed)
Referring Physician(s):  Martin,M  Supervising Physician: Sandi Mariscal  Patient Status:  [facility] - In-pt  Chief Complaint: Pelvic abscess   Subjective:  Pt sitting up in chair; working on computer; states mid abdominal pain is minimal; denies nausea or vomiting  Allergies: Patient has no known allergies.  Medications: Prior to Admission medications   Medication Sig Start Date End Date Taking? Authorizing Provider  acetaminophen (TYLENOL) 325 MG tablet Take 650 mg by mouth 2 (two) times daily as needed for moderate pain.   Yes [provider]  sodium chloride (OCEAN) 0.65 % SOLN nasal spray Place 1 spray into both nostrils as needed for congestion.   Yes [provider]     Vital Signs: BP (!) 176/98 (BP Location: Left Arm)   Pulse 66   Temp 98.3 F (36.8 C) (Oral)   Resp 18   Ht 5\' 10"  (1.778 m)   Wt 170 lb (77.1 kg)   SpO2 100%   BMI 24.39 kg/m   Physical Exam mid lower abdominal drain intact, dressing clean and dry.  Output 10 cc blood-tinged fluid today, 45 cc yesterday; cx pend  Imaging: Ct Abdomen Pelvis W Contrast  Result Date: 05/13/2017 CLINICAL DATA:  Followup diverticulitis on antibiotic therapy. EXAM: CT ABDOMEN AND PELVIS WITH CONTRAST TECHNIQUE: Multidetector CT imaging of the abdomen and pelvis was performed using the standard protocol following bolus administration of intravenous contrast. CONTRAST:  125mL ISOVUE-300 IOPAMIDOL (ISOVUE-300) INJECTION 61% COMPARISON:  CT 05/09/2017 and 09/16/2015. FINDINGS: Lower chest: Mild linear atelectasis at the right lung base. The lung bases are otherwise clear. No significant pleural or pericardial effusion. Hepatobiliary: Stable low-density hepatic lesions on images 13 and 20, likely cysts. No worrisome hepatic findings. No evidence of gallstones, gallbladder wall thickening or biliary dilatation. Pancreas: Unremarkable. No pancreatic ductal dilatation or surrounding inflammatory changes. Spleen:  Normal in size without focal abnormality. Adrenals/Urinary Tract: Stable 2.1 x 1.7 cm left adrenal nodule, measuring 11 HU on the delayed post-contrast images, consistent with an adenoma. The right adrenal gland appears normal. The kidneys appear normal without evidence mass, calculus or hydronephrosis. There is mildly progressive superior bladder wall thickening. There is no air in the bladder lumen. Stomach/Bowel: The stomach and small bowel appear normal. No residual jejunal intussusception seen. There are diverticular changes throughout the distal colon. Extensive sigmoid colon wall thickening and surrounding inflammation are again noted consistent with acute diverticulitis. Peri diverticular abscess in the central false pelvis has mildly enlarged, measuring up to 6.1 x 3.6 cm on image 58. This contains fluid and air. The contrast has just passed into this region of the sigmoid colon and does not enter this collection. There are additional small fluid collections between the sigmoid colon and bladder, most obvious on the reformatted images and measuring up to 2.3 cm on coronal image 52. No evidence of bowel obstruction. Vascular/Lymphatic: There are no enlarged abdominal or pelvic lymph nodes. Minimal aortoiliac atherosclerosis. No acute vascular findings. Reproductive: Stable mild enlargement of the prostate gland. The seminal vesicles appear normal. Other: Mild generalized soft tissue edema. No free intraperitoneal air or extravasated enteric contrast. There is a small amount of free pelvic fluid. There is a small umbilical hernia containing only fat. Musculoskeletal: No acute or significant osseous findings. IMPRESSION: 1. Persistent changes of sigmoid diverticulosis with enlarging surrounding extraluminal air and fluid collections consistent with diverticular abscesses. No evidence of bowel obstruction, free air or contrast extravasation. 2. Mild generalized soft tissue edema. Stable minimal pelvic ascites.  3. Mildly progressive bladder wall thickening without evidence of colovesical fistula. 4. Stable left adrenal adenoma. Electronically Signed   By: Richardean Sale M.D.   On: 05/13/2017 11:39   Ct Image Guided Drainage By Percutaneous Catheter  Result Date: 05/14/2017 CLINICAL DATA:  Sigmoid colonic diverticulitis with associated diverticular abscess. EXAM: CT GUIDED CATHETER DRAINAGE OF PERITONEAL DIVERTICULAR ABSCESS ANESTHESIA/SEDATION: 2.0 mg IV Versed 100 mcg IV Fentanyl Total Moderate Sedation Time:  31 minutes The patient's level of consciousness and physiologic status were continuously monitored during the procedure by Radiology nursing. PROCEDURE: The procedure, risks, benefits, and alternatives were explained to the patient. Questions regarding the procedure were encouraged and answered. The patient understands and consents to the procedure. A time out was performed prior to initiating the procedure. Initial unenhanced CT was performed in a supine position through the lower abdomen and pelvis. The lower abdominal wall was prepped with chlorhexidine in a sterile fashion, and a sterile drape was applied covering the operative field. A sterile gown and sterile gloves were used for the procedure. Local anesthesia was provided with 1% Lidocaine. Under CT guidance, an 18 gauge trocar needle was advanced to the level of an anterior diverticular abscess to the left of midline in the peritoneal cavity. Aspiration was performed and a fluid sample sent for culture analysis. A guidewire was advanced. The tract was dilated to 34 [place]. A 12 French percutaneous drainage catheter was advanced over the guidewire. Catheter position was confirmed by CT after placement. The catheter was flushed and connected to a suction bulb. It was secured at the skin with a Prolene retention suture and StatLock device. COMPLICATIONS: None FINDINGS: Aspiration at the level of the diverticular abscess yielded grossly purulent fluid.  After placement of the drain, there is return of purulent and bloody fluid. IMPRESSION: Percutaneous catheter drainage under CT guidance of a sigmoid diverticular abscess. A 12 French drainage catheter was placed and attached to suction bulb drainage. Electronically Signed   By: Aletta Edouard M.D.   On: 05/14/2017 11:03    Labs:  CBC: Recent Labs    05/12/17 0550 05/13/17 0604 05/14/17 0611 05/15/17 0615  WBC 11.2* 8.7 7.4 7.8  HGB 11.5* 11.1* 10.8* 10.5*  HCT 33.6* 33.2* 33.1* 30.8*  PLT 397 409* 407* 360    COAGS: Recent Labs    05/14/17 0611  INR 1.17    BMP: Recent Labs    05/09/17 1125 05/10/17 0635 05/11/17 0626 05/15/17 0615 05/16/17 0536  NA 137 135 136 136  --   K 3.6 3.4* 3.6 3.7  --   CL 101 102 102 103  --   CO2 26 23 23 23   --   GLUCOSE 96 86 76 93  --   BUN 8 7 7  <5*  --   CALCIUM 8.5* 8.6* 8.6* 8.6*  --   CREATININE 1.00 1.03 1.01 1.02 1.08  GFRNONAA >60 >60 >60 >60 >60  GFRAA >60 >60 >60 >60 >60    LIVER FUNCTION TESTS: Recent Labs    05/09/17 1125 05/10/17 0635 05/15/17 0615  BILITOT 0.3 0.4 0.6  AST 16 15 14*  ALT 15* 13* 12*  ALKPHOS 59 57 52  PROT 7.8 7.4 6.5  ALBUMIN 3.4* 3.2* 2.9*    Assessment and Plan: S/p drainage of sigmoid diverticular abscess 2/12; afebrile; last WBC nl; hgb stable; creat nl; drain fluid cx pend; cont with drain irrigation once daily as OP (5 cc), record output and change dressing as needed.  Will schedule  for follow-up CT in IR drain clinic next week.     Electronically Signed: D. Rowe Robert, PA-C 05/16/2017, 4:14 PM   I spent a total of 15 minutes at the the patient's bedside AND on the patient's hospital floor or unit, greater than 50% of which was counseling/coordinating care for pelvic abscess drain    Patient ID: Daniel Mcmillan, male   DOB: [birth year], 47 y.o.   MRN: [id number]

## 2017-05-16 NOTE — Discharge Summary (Signed)
Physician Discharge Summary  Patient ID: Daniel Mcmillan MRN: [id number] DOB/AGE: July 28, 1970 47 y.o.  Admit date: 05/09/2017 Discharge date: 05/21/2017  Admission Diagnoses:  Diverticular abscess  Discharge Diagnoses:  Active Problems:   Diverticulitis of large intestine with perforation and abscess   PROCEDURES: IR drain placement, 05/14/17  Hospital Course:  Patient is a 47 y/o male who presents to [facility] from [facility] with cramping abdominal pain x4 days. Pain is intermittent across lower abdomen. Sometimes radiates across abdomen with gas. Patient does not note anything in particular that worsens pain, notes some improvement with defecation or passing flatus. Patient reports that he has been more constipated recently but had a small, loose BM this AM. Occasionally has bloody stools if he has multiple bowel movements in a day. Patient denies fever, chills, chest pain, SOB, nausea, vomiting, urinary symptoms. He has not taken any antibiotics in the last month. He has had pain similar to this in the past and has been treated as an outpatient for diverticulitis in the past. He has not had a colonoscopy. He is otherwise healthy and does not take any blood thinning medications. He has had bilateral inguinal hernia repairs (open). He is not allergic to any medications. He drinks 4-5 beers per day and uses marijuana. He denies current or past tobacco use. 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  Was admitted and placed on IV antibiotics.  He went to IR on 05/14/17 and IR drain was placed by Dr. Kathlene Cote.  Diverticular abscess yielded purulent fluid which was sent to the lab for culture.  At this time culture is still incomplete but the Gram stain showed gram-negative rods gram-positive cocci and gram-negative rods. He is showing good improvement on Zosyn.  His diet was advanced and we have put him on Augmentin on 05/16/17.  He continues to do well will aim for discharge tomorrow 05/17/17.  He will go home with the drain.   Follow-up with the drain clinic and then he will follow-up with Dr. Hassell Done in the office here in [place].  Results of the culture obtained at the time of his drain placement was finally completed on 05/21/17 after the patient's discharge.  This showed abundant E. coli which was resistant to ampicillin and Augmentin.  It was very sensitive to Septra and his medication was changed.  His blood pressure was an issue and he was discharged home on lisinopril 20 mg daily.  His blood pressure was improving at that point.  We emphasized the need to get a primary care physician to see him in the next week or 2.  He was instructed to monitor his blood pressure at home along with his heart rate at least twice a day.  He is to record that information and take with him to see his primary care physician on his first visit.  He was discharged in good condition on 05/16/17.  Condition on discharge: Improving.  CBC Latest Ref Rng & Units 05/17/2017 05/15/2017 05/14/2017  WBC 4.0 - 10.5 K/uL 7.0 7.8 7.4  Hemoglobin 13.0 - 17.0 g/dL 11.0(L) 10.5(L) 10.8(L)  Hematocrit 39.0 - 52.0 % 32.3(L) 30.8(L) 33.1(L)  Platelets 150 - 400 K/uL 405(H) 360 407(H)    CMP Latest Ref Rng & Units 05/17/2017 05/16/2017 05/15/2017  Glucose 65 - 99 mg/dL 94 - 93  BUN 6 - 20 mg/dL 7 - <5(L)  Creatinine 0.61 - 1.24 mg/dL 0.99 1.08 1.02  Sodium 135 - 145 mmol/L 139 - 136  Potassium 3.5 - 5.1 mmol/L 3.8 -  3.7  Chloride 101 - 111 mmol/L 106 - 103  CO2 22 - 32 mmol/L 24 - 23  Calcium 8.9 - 10.3 mg/dL 8.5(L) - 8.6(L)  Total Protein 6.5 - 8.1 g/dL 6.8 - 6.5  Total Bilirubin 0.3 - 1.2 mg/dL 0.4 - 0.6  Alkaline Phos 38 - 126 U/L 55 - 52  AST 15 - 41 U/L 12(L) - 14(L)  ALT 17 - 63 U/L 12(L) - 12(L)   Specimen Information: Abscess     Component 7d ago  Specimen Description ABSCESS LT ABDOMINAL  Performed at [facility], [place] [address] [place], [place] [address]     Special Requests Normal  Performed at [facility], [place] [address], [place], [place] [address]     Gram Stain ABUNDANT WBC PRESENT, PREDOMINANTLY PMN  FEW GRAM NEGATIVE RODS  RARE GRAM POSITIVE COCCI  Performed at [facility], [place] [address] [place], [place] [address]     Culture ABUNDANT ESCHERICHIA COLI  Confirmed Extended Spectrum Beta-Lactamase Producer (ESBL). In bloodstream infections from ESBL organisms, carbapenems are preferred over piperacillin/tazobactam. They are shown to have a lower risk of mortality.  MODERATE ESCHERICHIA COLI  Susceptibility patterns are not the same.  FEW PARABACTEROIDES DISTASONIS  FEW CLOSTRIDIUM SPECIES     Report Status 05/21/2017 FINAL   Organism ID, Bacteria ESCHERICHIA COLI   Organism ID, Bacteria ESCHERICHIA COLI   Resulting Agency [facility]  Susceptibility    Escherichia coli (ZZ00)    MIC    AMPICILLIN >=32 RESIST... Resistant    AMPICILLIN/SULBACTAM 8 SENSITIVE  Sensitive    CEFAZOLIN >=64 RESIST... Resistant    CEFEPIME RESISTANT  Resistant    CEFTAZIDIME RESISTANT  Resistant    CEFTRIAXONE >=64 RESIST... Resistant    CIPROFLOXACIN >=4 RESISTANT  Resistant    Extended ESBL POSITIVE  Resistant    GENTAMICIN <=1 SENSITIVE  Sensitive    IMIPENEM <=0.25 SENS... Sensitive    PIP/TAZO <=4 SENSITIVE  Sensitive    TRIMETH/SULFA <=20 SENSIT... Sensitive         Susceptibility    Escherichia coli (ZZ01)    MIC    AMPICILLIN >=32 RESIST... Resistant    AMPICILLIN/SULBACTAM >=32 RESIST... Resistant    CEFAZOLIN 8 SENSITIVE  Sensitive    CEFEPIME <=1 SENSITIVE  Sensitive    CEFTAZIDIME <=1 SENSITIVE  Sensitive    CEFTRIAXONE <=1 SENSITIVE  Sensitive    CIPROFLOXACIN <=0.25 SENS... Sensitive    Extended ESBL NEGATIVE  Sensitive    GENTAMICIN <=1 SENSITIVE  Sensitive    IMIPENEM <=0.25 SENS... Sensitive    PIP/TAZO <=4 SENSITIVE  Sensitive    TRIMETH/SULFA <=20 SENSIT... Sensitive         Susceptibility Comments   Escherichia coli (ZZ00)  ABUNDANT  ESCHERICHIA COLI  Escherichia coli (ZZ01)  MODERATE ESCHERICHIA COLI      Specimen Collected: 05/14/17 09:45 Last Resulted: 05/21/17 12:48        Disposition: 01-Home or Self Care   Allergies as of 05/17/2017   No Known Allergies     Medication List    TAKE these medications   acetaminophen 325 MG tablet Commonly known as:  TYLENOL You can take plain Tylenol 2 tablets every 4 hours as needed for pain.  You can alternate this with ibuprofen, or the Tylenol with codeine. DO NOT TAKE MORE THAN 4000 MG OF TYLENOL PER DAY.  IT CAN HARM YOUR LIVER.  TYLENOL (ACETAMINOPHEN) IS ALSO IN YOUR PRESCRIPTION PAIN MEDICATION.  YOU HAVE TO COUNT IT IN YOUR DAILY TOTAL. What changed:    how much to take  how to take this  when to take this  reasons to take this  additional instructions   acetaminophen-codeine 300-30 MG tablet Commonly known as:  TYLENOL #3 Take 1-2 tablets by mouth every 6 (six) hours as needed (moderate pain not relieved by ibuprofen).   amoxicillin-clavulanate 875-125 MG tablet Commonly known as:  AUGMENTIN Take 1 tablet by mouth every 12 (twelve) hours.   ibuprofen 200 MG tablet Commonly known as:  ADVIL,MOTRIN You can take 2-3 tablets every 6 hours as needed for pain.  You can alternate this with plain Tylenol or the Tylenol 3.  You can buy this over-the-counter at any drugstore.   lisinopril 20 MG tablet Commonly known as:  PRINIVIL,ZESTRIL Take 1 tablet daily.  Check your blood pressure at home at least 2 times per day and record.  Take this information to your primary care physician.  You should follow-up with primary care physician in 2-3 weeks.  We will not refill this prescription.   saccharomyces boulardii 250 MG capsule Commonly known as:  FLORASTOR You can buy this over-the-counter at any drugstore.  Follow package directions.  Use this until you have completed your course of oral antibiotics.   sodium chloride 0.65 % Soln nasal spray Commonly  known as:  OCEAN Place 1 spray into both nostrils as needed for congestion.   sodium chloride flush 0.9 % Soln Commonly known as:  NS Flush your drain twice a day with these, as instructed by Radiology.      Follow-up Information    Johnathan Hausen, MD Follow up on 06/07/2017.   Specialty:  General Surgery Why:  your appoinment is at 4PM, be at the office 30 minutes early for check in.  Bring photo ID and insurance information.You also need to see a primary care provider about your blood pressure.  Take your blood pressure, and HR at home twice a day and record. Contact information: [place] [address] [phone]        Primary care doctor. Follow up.   Why:  see who your insurance recommends.  Take your blood pressure and pulse twice a day, record, and take with you to your primary care physician.  A normal blood pressure is in the 120/80 range. Contact information: you need to get someone to follow you routinely, and especially your blood pressure.           SignedEarnstine Regal 05/21/2017, 3:51 PM

## 2017-05-16 NOTE — Discharge Instructions (Signed)
Percutaneous Abscess Drain °Percutaneous abscess drain is removal of a collection of infected fluid inside the body (abscess). This is done by placing a thin needle under the skin and moving it into the abscess. A small tube (catheter) is inserted during the procedure and left in place for a few days to continue to drain the abscess. °Tell a health care provider about: °· Any allergies you have. °· All medicines you are taking, including vitamins, herbs, eye drops, creams, and over-the-counter medicines. °· Any problems you or family members have had with anesthetic medicines. °· Any blood disorders you have. °· Any surgeries you have had. °· Any medical conditions you have. °· Whether you are pregnant or may be pregnant. °· Any history of tobacco use or smoking. °What are the risks? °Generally, this is a safe procedure. However, problems may occur, including: °· Infection. °· Bleeding. °· Allergic reaction to medicines or materials used. °· Damage to other structures or organs. °· Blockage of the catheter, requiring placement of a new catheter. °· A need to repeat the procedure. °· Failure of the procedure to drain the abscess completely, requiring an open surgical procedure to drain the abscess. An open procedure is done through a larger incision. ° °What happens before the procedure? °Medicines °· Ask your health care provider about: °? Changing or stopping your regular medicines. This is especially important if you are taking diabetes medicines or blood thinners. °? Taking medicines such as aspirin and ibuprofen. These medicines can thin your blood. Do not take these medicines before your procedure if your health care provider instructs you not to. °Staying hydrated °Follow instructions from your health care provider about hydration, which may include: °· Up to 2 hours before the procedure - you may continue to drink clear liquids, such as water, clear fruit juice, black coffee, and plain tea. ° °Eating and  drinking restrictions °Follow instructions from your health care provider about eating and drinking, which may include: °· 8 hours before the procedure - stop eating heavy meals or foods such as meat, fried foods, or fatty foods. °· 6 hours before the procedure - stop eating light meals or foods, such as toast or cereal. °· 6 hours before the procedure - stop drinking milk or drinks that contain milk. °· 2 hours before the procedure - stop drinking clear liquids. ° °General instructions ° °· Plan to have someone take you home from the hospital or clinic. °· If you will be going home right after the procedure, plan to have someone with you for 24 hours. °· You may have blood tests or urine tests. °· You may get a tetanus shot. °· You may have imaging tests, such as an ultrasound, to check how large or deep your abscess is. °What happens during the procedure? °· To lower your risk of infection: °? Your health care team will wash or sanitize their hands. °? The skin around the abscess will be washed with soap. °? Hair may be removed from the surgical site. °· An IV tube will be inserted into one of your veins. °· You will be given medicine to numb the area (local anesthetic) where the catheter will be placed. Placement of the catheter varies depending on where your abscess is located. °· You may be given medicine to help you relax (sedative) or medicine to make you fall asleep (general anesthetic). °· A small incision will be made in your skin. °· A needle will be inserted under your skin and moved   into the abscess. Images from ultrasound, X-ray, or a CT scan will be used to help guide the needle to the abscess. °· A catheter will be inserted into your incision and moved underneath your skin until it reaches the abscess. Images will be used to help guide the catheter to the abscess. °· After the catheter is in place, the needle will be removed. The catheter will be connected to a bag outside of your body. The catheter  will stay in place until the fluid has stopped draining and the infection is gone. °The procedure may vary among health care providers and hospitals. °What happens after the procedure? °· Your blood pressure, heart rate, breathing rate, and blood oxygen level will be monitored until the medicines you were given have worn off. °· You may have some pain or nausea. Medicines will be available to help you. °Summary °· An abscess is a collection of infected fluid inside the body. °· During this procedure, images from ultrasound, X-rays, or a CT scan are used to help guide the needle and catheter to the abscess. °· A catheter will be left in place to continue to drain the abscess after the procedure. °This information is not intended to replace advice given to you by your health care provider. Make sure you discuss any questions you have with your health care provider. °Document Released: 08/03/2013 Document Revised: 02/09/2016 Document Reviewed: 02/09/2016 °Elsevier Interactive Patient Education © 2017 [employer] ° °

## 2017-05-17 ENCOUNTER — Other Ambulatory Visit: Payer: Self-pay | Admitting: Surgery

## 2017-05-17 LAB — CBC
HCT: 32.3 % — ABNORMAL LOW (ref 39.0–52.0)
HEMOGLOBIN: 11 g/dL — AB (ref 13.0–17.0)
MCH: 27.3 pg (ref 26.0–34.0)
MCHC: 34.1 g/dL (ref 30.0–36.0)
MCV: 80.1 fL (ref 78.0–100.0)
PLATELETS: 405 10*3/uL — AB (ref 150–400)
RBC: 4.03 MIL/uL — AB (ref 4.22–5.81)
RDW: 13.9 % (ref 11.5–15.5)
WBC: 7 10*3/uL (ref 4.0–10.5)

## 2017-05-17 LAB — COMPREHENSIVE METABOLIC PANEL
ALBUMIN: 2.9 g/dL — AB (ref 3.5–5.0)
ALK PHOS: 55 U/L (ref 38–126)
ALT: 12 U/L — ABNORMAL LOW (ref 17–63)
AST: 12 U/L — AB (ref 15–41)
Anion gap: 9 (ref 5–15)
BUN: 7 mg/dL (ref 6–20)
CHLORIDE: 106 mmol/L (ref 101–111)
CO2: 24 mmol/L (ref 22–32)
CREATININE: 0.99 mg/dL (ref 0.61–1.24)
Calcium: 8.5 mg/dL — ABNORMAL LOW (ref 8.9–10.3)
GFR calc non Af Amer: >60 mL/min (ref >60)
GLUCOSE: 94 mg/dL (ref 65–99)
Potassium: 3.8 mmol/L (ref 3.5–5.1)
SODIUM: 139 mmol/L (ref 135–145)
Total Bilirubin: 0.4 mg/dL (ref 0.3–1.2)
Total Protein: 6.8 g/dL (ref 6.5–8.1)

## 2017-05-17 MED ORDER — ACETAMINOPHEN-CODEINE #3 300-30 MG PO TABS: 1.0000 | ORAL_TABLET | Freq: Four times a day (QID) | ORAL | 0 refills | Status: DC | PRN

## 2017-05-17 MED ORDER — AMOXICILLIN-POT CLAVULANATE 875-125 MG PO TABS: 1.0000 | ORAL_TABLET | Freq: Two times a day (BID) | ORAL | 0 refills | Status: DC

## 2017-05-17 MED ORDER — SODIUM CHLORIDE 0.9% FLUSH: INTRAVENOUS | 1 refills | Status: DC

## 2017-05-17 MED ORDER — ACETAMINOPHEN 325 MG PO TABS: ORAL_TABLET | ORAL | Status: DC

## 2017-05-17 MED ORDER — HYDROCHLOROTHIAZIDE 12.5 MG PO CAPS: 12.5000 mg | ORAL_CAPSULE | Freq: Every day | ORAL | Status: DC

## 2017-05-17 MED ORDER — SACCHAROMYCES BOULARDII 250 MG PO CAPS: ORAL_CAPSULE | ORAL | Status: AC

## 2017-05-17 MED ORDER — IBUPROFEN 200 MG PO TABS: ORAL_TABLET | ORAL | Status: AC

## 2017-05-17 MED ORDER — LISINOPRIL 20 MG PO TABS: ORAL_TABLET | ORAL | 0 refills | Status: AC

## 2017-05-17 NOTE — Progress Notes (Signed)
CC: Abdominal pain  Subjective: He looks great, we talked about pain and his BP.  He understands plan and follow up.   Objective: Vital signs in last 24 hours: Temp:  [98.1 F (36.7 C)-98.3 F (36.8 C)] 98.1 F (36.7 C) (02/15 0519) Pulse Rate:  [78] 78 (02/15 0519) Resp:  [18] 18 (02/15 0519) BP: (145)/(99) 145/99 (02/15 0519) SpO2:  [100 %] 100 % (02/15 0519) Last BM Date: 05/15/17 PO not recorded Drain 10 IV 534 Afebrile, VSS BP is improving on Lisinopril Labs OK Creatinine is normal WBC is normal 4 Tylenol #3 yesterday and this AM Motrin x 1 Robaxin x 3  Intake/Output from previous day: 02/14 0701 - 02/15 0700 In: 554.2 [I.V.:534.2] Out: 10 [Drains:10] Intake/Output this shift: No intake/output data recorded.  General appearance: alert, cooperative and no distress Resp: clear to auscultation bilaterally GI: soft, no complaints of pain.  drain is clear and serous.  Lab Results:  Recent Labs    05/15/17 0615 05/17/17 0610  WBC 7.8 7.0  HGB 10.5* 11.0*  HCT 30.8* 32.3*  PLT 360 405*    BMET Recent Labs    05/15/17 0615 05/16/17 0536 05/17/17 0610  NA 136  --  139  K 3.7  --  3.8  CL 103  --  106  CO2 23  --  24  GLUCOSE 93  --  94  BUN <5*  --  7  CREATININE 1.02 1.08 0.99  CALCIUM 8.6*  --  8.5*   PT/INR No results for input(s): LABPROT, INR in the last 72 hours.  Recent Labs  Lab 05/15/17 0615 05/17/17 0610  AST 14* 12*  ALT 12* 12*  ALKPHOS 52 55  BILITOT 0.6 0.4  PROT 6.5 6.8  ALBUMIN 2.9* 2.9*     Lipase     Component Value Date/Time   LIPASE 14 09/16/2015 1234     Medications: . amoxicillin-clavulanate  1 tablet Oral Q12H  . enoxaparin (LOVENOX) injection  40 mg Subcutaneous Q24H  . folic acid  1 mg Oral Daily  . lisinopril  20 mg Oral Daily  . multivitamin with minerals  1 tablet Oral Daily  . saccharomyces boulardii  250 mg Oral BID  . sodium chloride flush  10 mL Intravenous Q8H  . sodium chloride flush  10  mL Intravenous Q12H  . thiamine  100 mg Oral Daily   Or  . thiamine  100 mg Intravenous Daily    Assessment/Plan Diverticulitis with abscess - CT2/7/19:diverticular abscess 4.4 x 2.9 x 3.0 cm containing fluid and air; unable to drain in IR - WBC now 7.8, afebrile, VSS - no pain on exam and patient tolerating full liquids - Repeat CT scan2/11:Peri diverticular abscess in the central false pelvis has mildly enlarged, measuring up to 6.1 x 3.6 cm-  IR drain Placed 05/14/17  Alcohol abuse- patient drinks 4-5 beers daily, CIWA protocol  Hypokalemia-resolved Hypertension - Lisinopril 20 mg daily at home.  Follow-up with PCP as soon as possible.  He is to check his blood pressures at home and record them.  Bring the results to his primary care physician.   FEN:IVF, advanced to regular diet this AM VTE: SCDs, lovenox ID: IV zosyn 2/7-13 /  Augmentin 2/14=>> plan to keep him on this till her returns to see Dr. Wyvonne Lenz: none   Plan: Home today on oral antibiotics, probiotic with drain.  He is being scheduled by the drain clinic for follow-up next week.  He has follow-up with Dr. Hassell Done on 06/07/17.  He is going to record his blood pressures at home.  We are sending him home on lisinopril 20 mg daily.  He is to make arrangements to have a primary care physician see him and follow his blood pressure. I have personally reviewed the patients medication history on the [place] controlled substance database.   LOS: 8 days    Chanler Mendonca 05/17/2017 [phone]

## 2017-05-17 NOTE — Progress Notes (Signed)
[id number]/WCHENI Victorio Palm [phone]/[id number] [facility] notified of need for home rn.

## 2017-05-20 ENCOUNTER — Other Ambulatory Visit (HOSPITAL_COMMUNITY): Payer: Self-pay | Admitting: Radiology

## 2017-05-20 ENCOUNTER — Other Ambulatory Visit: Payer: Self-pay | Admitting: Surgery

## 2017-05-20 DIAGNOSIS — K651 Peritoneal abscess: Secondary | ICD-10-CM

## 2017-05-21 ENCOUNTER — Ambulatory Visit
Admission: RE | Admit: 2017-05-21 | Discharge: 2017-05-21 | Disposition: A | Discharge status: Home / Self Care | Payer: No Typology Code available for payment source | Source: Ambulatory Visit | Attending: Surgery | Admitting: Surgery

## 2017-05-21 ENCOUNTER — Encounter: Payer: Self-pay | Admitting: Radiology

## 2017-05-21 ENCOUNTER — Ambulatory Visit
Admission: RE | Admit: 2017-05-21 | Discharge: 2017-05-21 | Disposition: A | Discharge status: Home / Self Care | Payer: No Typology Code available for payment source | Source: Ambulatory Visit | Attending: Radiology | Admitting: Radiology

## 2017-05-21 DIAGNOSIS — K651 Peritoneal abscess: Secondary | ICD-10-CM

## 2017-05-21 HISTORY — PX: IR RADIOLOGIST EVAL & MGMT: IMG5224

## 2017-05-21 LAB — AEROBIC/ANAEROBIC CULTURE W GRAM STAIN (SURGICAL/DEEP WOUND)

## 2017-05-21 LAB — AEROBIC/ANAEROBIC CULTURE (SURGICAL/DEEP WOUND): SPECIAL REQUESTS: NORMAL

## 2017-05-21 MED ORDER — IOPAMIDOL (ISOVUE-300) INJECTION 61%
100.0000 mL | Freq: Once | INTRAVENOUS | Status: AC | PRN
  Administered 2017-05-21: 100 mL via INTRAVENOUS

## 2017-05-21 NOTE — Progress Notes (Signed)
Patient ID: Daniel Mcmillan, male   DOB: 1970-10-19, 47 y.o.   MRN: [id number]    Referring Physician(s): Dr. Johnathan Hausen  Chief Complaint: The patient is seen in follow up today s/p diverticular abscess drain placement  History of present illness:  This is a 47 yo male with a history of diverticulitis who was admitted to [facility] on 05-09-17.  He had a CT scan that revealed a diverticular abscess which was not amenable to percutaneous drainage.  He had a repeat CT scan on 05-13-17 that revealed an enlarging collection.  At this point, the collection was amenable to drainage and a drain was placed.  He tolerated this well.  He was subsequently discharged home on oral augmentin a couple of days later.  He denies any fevers since being home, pain has improved, and he is only having about 5 cc of serosanguineous output a day.  He returns today for a follow up CT scan and drain injection.  Past Medical History:  Diagnosis Date  . Diverticulosis     Past Surgical History:  Procedure Laterality Date  . HERNIA REPAIR      Allergies: Patient has no known allergies.  Medications: Prior to Admission medications   Medication Sig Start Date End Date Taking? Authorizing Provider  acetaminophen (TYLENOL) 325 MG tablet You can take plain Tylenol 2 tablets every 4 hours as needed for pain.  You can alternate this with ibuprofen, or the Tylenol with codeine. DO NOT TAKE MORE THAN 4000 MG OF TYLENOL PER DAY.  IT CAN HARM YOUR LIVER.  TYLENOL (ACETAMINOPHEN) IS ALSO IN YOUR PRESCRIPTION PAIN MEDICATION.  YOU HAVE TO COUNT IT IN YOUR DAILY TOTAL. 05/17/17   Earnstine Regal, PA-C  acetaminophen-codeine (TYLENOL #3) 300-30 MG tablet Take 1-2 tablets by mouth every 6 (six) hours as needed (moderate pain not relieved by ibuprofen). 05/17/17   Earnstine Regal, PA-C  amoxicillin-clavulanate (AUGMENTIN) 875-125 MG tablet Take 1 tablet by mouth every 12 (twelve) hours. 05/17/17   Earnstine Regal, PA-C  ibuprofen  (ADVIL,MOTRIN) 200 MG tablet You can take 2-3 tablets every 6 hours as needed for pain.  You can alternate this with plain Tylenol or the Tylenol 3.  You can buy this over-the-counter at any drugstore. 05/17/17   Earnstine Regal, PA-C  lisinopril (PRINIVIL,ZESTRIL) 20 MG tablet Take 1 tablet daily.  Check your blood pressure at home at least 2 times per day and record.  Take this information to your primary care physician.  You should follow-up with primary care physician in 2-3 weeks.  We will not refill this prescription. 05/17/17   Earnstine Regal, PA-C  saccharomyces boulardii (FLORASTOR) 250 MG capsule You can buy this over-the-counter at any drugstore.  Follow package directions.  Use this until you have completed your course of oral antibiotics. 05/17/17   Earnstine Regal, PA-C  sodium chloride (OCEAN) 0.65 % SOLN nasal spray Place 1 spray into both nostrils as needed for congestion.    [provider]  sodium chloride flush (NS) 0.9 % SOLN Flush your drain twice a day with these, as instructed by Radiology. 05/17/17   Earnstine Regal, PA-C     No family history on file.  Social History   Socioeconomic History  . Marital status: Single    Spouse name: Not on file  . Number of children: Not on file  . Years of education: Not on file  . Highest education level: Not on file  Social Needs  . Financial resource strain: Not on  file  . Food insecurity - worry: Not on file  . Food insecurity - inability: Not on file  . Transportation needs - medical: Not on file  . Transportation needs - non-medical: Not on file  Occupational History  . Not on file  Tobacco Use  . Smoking status: Never Smoker  . Smokeless tobacco: Never Used  Substance and Sexual Activity  . Alcohol use: Yes    Alcohol/week: 3.0 oz    Types: 5 Cans of beer per week    Comment: daily  . Drug use: Yes    Types: Marijuana    Comment: Last used 05/08/17  . Sexual activity: Not on file  Other Topics Concern    . Not on file  Social History Narrative  . Not on file     Vital Signs: There were no vitals taken for this visit.  Physical Exam  Abd: soft, NT, ND, +BS, drain site is c/d/i.  Drain injection shows no fistula.  Drain was removed in its entirety and without difficulty.  Drain site covered with a clean dry dressing.  Imaging: Ct Abdomen Pelvis W Contrast  Result Date: 05/21/2017 CLINICAL DATA:  Follow-up abscess drain EXAM: CT ABDOMEN AND PELVIS WITH CONTRAST TECHNIQUE: Multidetector CT imaging of the abdomen and pelvis was performed using the standard protocol following bolus administration of intravenous contrast. CONTRAST:  158mL ISOVUE-300 IOPAMIDOL (ISOVUE-300) INJECTION 61% COMPARISON:  05/13/2017 FINDINGS: Lower chest: No acute abnormality. Hepatobiliary: Tiny low-density lesion in the anterior right lobe on image 16 of series 2 is stable. Gallbladder is unremarkable. Pancreas: Unremarkable Spleen: Unremarkable Adrenals/Urinary Tract: Stable tiny left adrenal nodule. Kidneys are unremarkable. Right adrenal gland is unremarkable. Mild wall thickening of the posterior lower bladder wall has improved. The tiny fluid collection within the posterior bladder wall has almost nearly completely resolved. Stomach/Bowel: Wall thickening of the sigmoid colon and inflammatory changes have improved. Diverticulosis throughout the length of the colon is stable. No evidence of small-bowel obstruction. Vascular/Lymphatic: Small para-aortic lymph nodes. Reproductive: Prostate is still mildly enlarged. Other: There is a small amount of free fluid adjacent to the inferior tip of the liver as well as within the pelvis. An abscess drain has been placed into the lower abdominal abscess cavity. The abscess has resolved. Musculoskeletal: No vertebral compression deformity IMPRESSION: After drain placement, the lower abdominal abscess cavity has resolved. Changes related to sigmoid diverticulitis have improved. Wall  thickening and a tiny fluid collection involving the posterior wall of the bladder have improved. Electronically Signed   By: Marybelle Killings M.D.   On: 05/21/2017 12:42    Labs:  CBC: Recent Labs    05/13/17 0604 05/14/17 0611 05/15/17 0615 05/17/17 0610  WBC 8.7 7.4 7.8 7.0  HGB 11.1* 10.8* 10.5* 11.0*  HCT 33.2* 33.1* 30.8* 32.3*  PLT 409* 407* 360 405*    COAGS: Recent Labs    05/14/17 0611  INR 1.17    BMP: Recent Labs    05/10/17 0635 05/11/17 0626 05/15/17 0615 05/16/17 0536 05/17/17 0610  NA 135 136 136  --  139  K 3.4* 3.6 3.7  --  3.8  CL 102 102 103  --  106  CO2 23 23 23   --  24  GLUCOSE 86 76 93  --  94  BUN 7 7 <5*  --  7  CALCIUM 8.6* 8.6* 8.6*  --  8.5*  CREATININE 1.03 1.01 1.02 1.08 0.99  GFRNONAA >60 >60 >60 >60 >60  GFRAA >60 >60 >  60 >60 >60    LIVER FUNCTION TESTS: Recent Labs    05/09/17 1125 05/10/17 0635 05/15/17 0615 05/17/17 0610  BILITOT 0.3 0.4 0.6 0.4  AST 16 15 14* 12*  ALT 15* 13* 12* 12*  ALKPHOS 59 57 52 55  PROT 7.8 7.4 6.5 6.8  ALBUMIN 3.4* 3.2* 2.9* 2.9*    Assessment:  1. Diverticular abscess, s/p perc drain  The patient is doing well with no further abdominal pain.  His follow up CT scan shows resolution of his collection.  His drain was injected which revealed no evidence of fistula.  His drain was felt to be ready for removal.  This was done with no issues.  He does not need any further follow up with IR.  He will see Dr. Hassell Done on 06-07-17.  Signed: Henreitta Cea, PA-C 05/21/2017, 1:22 PM   Please refer to Dr. Barbie Banner' attestation of this note for management and plan.

## 2017-06-12 ENCOUNTER — Other Ambulatory Visit (HOSPITAL_COMMUNITY): Payer: Self-pay | Admitting: Surgery

## 2017-06-12 DIAGNOSIS — K5792 Diverticulitis of intestine, part unspecified, without perforation or abscess without bleeding: Secondary | ICD-10-CM

## 2017-08-01 ENCOUNTER — Ambulatory Visit (HOSPITAL_COMMUNITY): Payer: Self-pay

## 2017-08-01 ENCOUNTER — Encounter (HOSPITAL_COMMUNITY): Payer: Self-pay

## 2017-09-23 ENCOUNTER — Ambulatory Visit (HOSPITAL_COMMUNITY): Admission: RE | Admit: 2017-09-23 | Payer: Self-pay | Source: Ambulatory Visit

## 2017-10-10 ENCOUNTER — Ambulatory Visit (HOSPITAL_COMMUNITY): Admission: RE | Admit: 2017-10-10 | Payer: Self-pay | Source: Ambulatory Visit

## 2017-10-29 ENCOUNTER — Ambulatory Visit (HOSPITAL_COMMUNITY): Admission: RE | Admit: 2017-10-29 | Payer: Self-pay | Source: Ambulatory Visit

## 2017-11-12 ENCOUNTER — Ambulatory Visit (HOSPITAL_COMMUNITY)
Admission: RE | Admit: 2017-11-12 | Discharge: 2017-11-12 | Disposition: A | Discharge status: Home / Self Care | Payer: Self-pay | Source: Ambulatory Visit | Attending: Surgery | Admitting: Surgery

## 2017-11-12 ENCOUNTER — Other Ambulatory Visit (HOSPITAL_COMMUNITY): Payer: Self-pay | Admitting: Surgery

## 2017-11-12 DIAGNOSIS — K5792 Diverticulitis of intestine, part unspecified, without perforation or abscess without bleeding: Secondary | ICD-10-CM

## 2017-11-14 ENCOUNTER — Other Ambulatory Visit (HOSPITAL_COMMUNITY): Payer: Self-pay | Admitting: Surgery

## 2017-11-14 DIAGNOSIS — K5792 Diverticulitis of intestine, part unspecified, without perforation or abscess without bleeding: Secondary | ICD-10-CM

## 2017-11-28 ENCOUNTER — Other Ambulatory Visit (HOSPITAL_COMMUNITY): Payer: Self-pay | Admitting: Surgery

## 2017-11-28 ENCOUNTER — Ambulatory Visit (HOSPITAL_COMMUNITY)
Admission: RE | Admit: 2017-11-28 | Discharge: 2017-11-28 | Disposition: A | Discharge status: Home / Self Care | Payer: Self-pay | Source: Ambulatory Visit | Attending: Surgery | Admitting: Surgery

## 2017-11-28 DIAGNOSIS — K5792 Diverticulitis of intestine, part unspecified, without perforation or abscess without bleeding: Secondary | ICD-10-CM

## 2018-01-13 ENCOUNTER — Other Ambulatory Visit (HOSPITAL_COMMUNITY): Payer: Self-pay | Admitting: Surgery

## 2018-01-13 DIAGNOSIS — K5792 Diverticulitis of intestine, part unspecified, without perforation or abscess without bleeding: Secondary | ICD-10-CM

## 2018-01-29 ENCOUNTER — Ambulatory Visit (HOSPITAL_COMMUNITY)
Admission: RE | Admit: 2018-01-29 | Discharge: 2018-01-29 | Disposition: A | Discharge status: Home / Self Care | Payer: Self-pay | Source: Ambulatory Visit | Attending: Surgery | Admitting: Surgery

## 2018-01-29 DIAGNOSIS — K5792 Diverticulitis of intestine, part unspecified, without perforation or abscess without bleeding: Secondary | ICD-10-CM | POA: Insufficient documentation

## 2018-01-29 MED ORDER — SODIUM CHLORIDE 0.9 % IJ SOLN
INTRAMUSCULAR | Status: AC
  Filled 2018-01-29: qty 50

## 2018-01-29 MED ORDER — IOHEXOL 300 MG/ML  SOLN
100.0000 mL | Freq: Once | INTRAMUSCULAR | Status: AC | PRN
  Administered 2018-01-29: 100 mL via INTRAVENOUS

## 2019-04-11 ENCOUNTER — Encounter (HOSPITAL_COMMUNITY): Payer: Self-pay | Admitting: Emergency Medicine

## 2019-04-11 ENCOUNTER — Other Ambulatory Visit: Payer: Self-pay

## 2019-04-11 ENCOUNTER — Emergency Department (HOSPITAL_COMMUNITY)
Admission: EM | Admit: 2019-04-11 | Discharge: 2019-04-11 | Disposition: A | Discharge status: Home / Self Care | Payer: BC Managed Care – PPO | Attending: Emergency Medicine | Admitting: Emergency Medicine

## 2019-04-11 DIAGNOSIS — R109 Unspecified abdominal pain: Secondary | ICD-10-CM | POA: Insufficient documentation

## 2019-04-11 DIAGNOSIS — Z5321 Procedure and treatment not carried out due to patient leaving prior to being seen by health care provider: Secondary | ICD-10-CM | POA: Diagnosis not present

## 2019-04-11 LAB — COMPREHENSIVE METABOLIC PANEL
ALT: 15 U/L (ref 0–44)
AST: 17 U/L (ref 15–41)
Albumin: 3.8 g/dL (ref 3.5–5.0)
Alkaline Phosphatase: 63 U/L (ref 38–126)
Anion gap: 9 (ref 5–15)
BUN: 8 mg/dL (ref 6–20)
CO2: 25 mmol/L (ref 22–32)
Calcium: 8.8 mg/dL — ABNORMAL LOW (ref 8.9–10.3)
Chloride: 91 mmol/L — ABNORMAL LOW (ref 98–111)
Creatinine, Ser: 0.8 mg/dL (ref 0.61–1.24)
GFR calc Af Amer: >60 mL/min (ref >60)
GFR calc non Af Amer: >60 mL/min (ref >60)
Glucose, Bld: 114 mg/dL — ABNORMAL HIGH (ref 70–99)
Potassium: 3.6 mmol/L (ref 3.5–5.1)
Sodium: 125 mmol/L — ABNORMAL LOW (ref 135–145)
Total Bilirubin: 0.9 mg/dL (ref 0.3–1.2)
Total Protein: 7.7 g/dL (ref 6.5–8.1)

## 2019-04-11 LAB — CBC
HCT: 42.1 % (ref 39.0–52.0)
Hemoglobin: 14.1 g/dL (ref 13.0–17.0)
MCH: 28.2 pg (ref 26.0–34.0)
MCHC: 33.5 g/dL (ref 30.0–36.0)
MCV: 84.2 fL (ref 80.0–100.0)
Platelets: 267 10*3/uL (ref 150–400)
RBC: 5 MIL/uL (ref 4.22–5.81)
RDW: 14.2 % (ref 11.5–15.5)
WBC: 9.2 10*3/uL (ref 4.0–10.5)
nRBC: 0 % (ref 0.0–0.2)

## 2019-04-11 LAB — LIPASE, BLOOD: Lipase: 16 U/L (ref 11–51)

## 2019-04-11 NOTE — ED Triage Notes (Signed)
Pt sent from UC for abd pains x 5 days and to rule out appendicitis. Hx diverticulitis with abscess. Uses ETOH and pot everyday.

## 2019-04-11 NOTE — ED Notes (Signed)
Pt notified front desk staff he was leaving AMA.

## 2019-04-11 NOTE — ED Notes (Signed)
Save blue in main lab 

## 2019-04-16 ENCOUNTER — Other Ambulatory Visit: Payer: Self-pay | Admitting: Surgery

## 2019-04-16 DIAGNOSIS — K5732 Diverticulitis of large intestine without perforation or abscess without bleeding: Secondary | ICD-10-CM

## 2019-04-17 ENCOUNTER — Inpatient Hospital Stay (HOSPITAL_COMMUNITY)
Admission: RE | Admit: 2019-04-17 | Discharge: 2019-04-29 | DRG: 329 | Disposition: A | Discharge status: Home Health | Payer: BC Managed Care – PPO | Attending: General Surgery | Admitting: General Surgery

## 2019-04-17 ENCOUNTER — Ambulatory Visit: Payer: Self-pay | Admitting: General Surgery

## 2019-04-17 ENCOUNTER — Inpatient Hospital Stay (HOSPITAL_COMMUNITY): Payer: BC Managed Care – PPO | Admitting: Certified Registered Nurse Anesthetist

## 2019-04-17 ENCOUNTER — Encounter (HOSPITAL_COMMUNITY): Payer: Self-pay

## 2019-04-17 ENCOUNTER — Encounter (HOSPITAL_COMMUNITY): Admission: RE | Disposition: A | Discharge status: Home Health | Payer: Self-pay | Source: Home / Self Care

## 2019-04-17 ENCOUNTER — Ambulatory Visit
Admission: RE | Admit: 2019-04-17 | Discharge: 2019-04-17 | Disposition: A | Discharge status: Home / Self Care | Payer: BC Managed Care – PPO | Source: Ambulatory Visit | Attending: Surgery | Admitting: Surgery

## 2019-04-17 ENCOUNTER — Other Ambulatory Visit: Payer: Self-pay

## 2019-04-17 DIAGNOSIS — K56699 Other intestinal obstruction unspecified as to partial versus complete obstruction: Secondary | ICD-10-CM

## 2019-04-17 DIAGNOSIS — E43 Unspecified severe protein-calorie malnutrition: Secondary | ICD-10-CM

## 2019-04-17 DIAGNOSIS — Z79899 Other long term (current) drug therapy: Secondary | ICD-10-CM | POA: Diagnosis not present

## 2019-04-17 DIAGNOSIS — K56609 Unspecified intestinal obstruction, unspecified as to partial versus complete obstruction: Secondary | ICD-10-CM

## 2019-04-17 DIAGNOSIS — K651 Peritoneal abscess: Secondary | ICD-10-CM | POA: Diagnosis not present

## 2019-04-17 DIAGNOSIS — K658 Other peritonitis: Secondary | ICD-10-CM | POA: Diagnosis present

## 2019-04-17 DIAGNOSIS — K5651 Intestinal adhesions [bands], with partial obstruction: Secondary | ICD-10-CM | POA: Diagnosis present

## 2019-04-17 DIAGNOSIS — Z932 Ileostomy status: Secondary | ICD-10-CM

## 2019-04-17 DIAGNOSIS — K560 Paralytic ileus: Secondary | ICD-10-CM | POA: Diagnosis not present

## 2019-04-17 DIAGNOSIS — Z934 Other artificial openings of gastrointestinal tract status: Secondary | ICD-10-CM

## 2019-04-17 DIAGNOSIS — I700 Atherosclerosis of aorta: Secondary | ICD-10-CM | POA: Diagnosis present

## 2019-04-17 DIAGNOSIS — E871 Hypo-osmolality and hyponatremia: Secondary | ICD-10-CM | POA: Diagnosis not present

## 2019-04-17 DIAGNOSIS — K7689 Other specified diseases of liver: Secondary | ICD-10-CM

## 2019-04-17 DIAGNOSIS — Z20822 Contact with and (suspected) exposure to covid-19: Secondary | ICD-10-CM | POA: Diagnosis present

## 2019-04-17 DIAGNOSIS — F1020 Alcohol dependence, uncomplicated: Secondary | ICD-10-CM | POA: Diagnosis present

## 2019-04-17 DIAGNOSIS — J90 Pleural effusion, not elsewhere classified: Secondary | ICD-10-CM | POA: Diagnosis not present

## 2019-04-17 DIAGNOSIS — K5732 Diverticulitis of large intestine without perforation or abscess without bleeding: Secondary | ICD-10-CM

## 2019-04-17 DIAGNOSIS — R32 Unspecified urinary incontinence: Secondary | ICD-10-CM

## 2019-04-17 DIAGNOSIS — E8770 Fluid overload, unspecified: Secondary | ICD-10-CM | POA: Diagnosis not present

## 2019-04-17 DIAGNOSIS — Z8719 Personal history of other diseases of the digestive system: Secondary | ICD-10-CM

## 2019-04-17 DIAGNOSIS — D3502 Benign neoplasm of left adrenal gland: Secondary | ICD-10-CM | POA: Diagnosis present

## 2019-04-17 DIAGNOSIS — K5730 Diverticulosis of large intestine without perforation or abscess without bleeding: Secondary | ICD-10-CM | POA: Diagnosis present

## 2019-04-17 DIAGNOSIS — I10 Essential (primary) hypertension: Secondary | ICD-10-CM

## 2019-04-17 DIAGNOSIS — K5720 Diverticulitis of large intestine with perforation and abscess without bleeding: Principal | ICD-10-CM | POA: Diagnosis present

## 2019-04-17 DIAGNOSIS — N3289 Other specified disorders of bladder: Secondary | ICD-10-CM | POA: Diagnosis not present

## 2019-04-17 DIAGNOSIS — K3532 Acute appendicitis with perforation and localized peritonitis, without abscess: Secondary | ICD-10-CM

## 2019-04-17 DIAGNOSIS — R198 Other specified symptoms and signs involving the digestive system and abdomen: Secondary | ICD-10-CM | POA: Diagnosis present

## 2019-04-17 DIAGNOSIS — R1030 Lower abdominal pain, unspecified: Secondary | ICD-10-CM | POA: Diagnosis present

## 2019-04-17 DIAGNOSIS — K567 Ileus, unspecified: Secondary | ICD-10-CM

## 2019-04-17 DIAGNOSIS — N321 Vesicointestinal fistula: Secondary | ICD-10-CM

## 2019-04-17 DIAGNOSIS — K5790 Diverticulosis of intestine, part unspecified, without perforation or abscess without bleeding: Secondary | ICD-10-CM | POA: Diagnosis present

## 2019-04-17 HISTORY — PX: ILEOCECETOMY: SHX5857

## 2019-04-17 HISTORY — PX: LAPAROTOMY: SHX154

## 2019-04-17 HISTORY — DX: Alcohol abuse, uncomplicated: F10.10

## 2019-04-17 HISTORY — PX: ILEOSTOMY: SHX1783

## 2019-04-17 HISTORY — PX: COLECTOMY WITH COLOSTOMY CREATION/HARTMANN PROCEDURE: SHX6598

## 2019-04-17 LAB — COMPREHENSIVE METABOLIC PANEL
ALT: 29 U/L (ref 0–44)
ALT: 19 U/L (ref 0–44)
AST: 33 U/L (ref 15–41)
AST: 23 U/L (ref 15–41)
Albumin: 3.2 g/dL — ABNORMAL LOW (ref 3.5–5.0)
Albumin: 2.7 g/dL — ABNORMAL LOW (ref 3.5–5.0)
Alkaline Phosphatase: 83 U/L (ref 38–126)
Alkaline Phosphatase: 57 U/L (ref 38–126)
Anion gap: 18 — ABNORMAL HIGH (ref 5–15)
Anion gap: 11 (ref 5–15)
BUN: 19 mg/dL (ref 6–20)
BUN: 21 mg/dL — ABNORMAL HIGH (ref 6–20)
CO2: 22 mmol/L (ref 22–32)
CO2: 24 mmol/L (ref 22–32)
Calcium: 8.4 mg/dL — ABNORMAL LOW (ref 8.9–10.3)
Calcium: 7.7 mg/dL — ABNORMAL LOW (ref 8.9–10.3)
Chloride: 85 mmol/L — ABNORMAL LOW (ref 98–111)
Chloride: 93 mmol/L — ABNORMAL LOW (ref 98–111)
Creatinine, Ser: 0.93 mg/dL (ref 0.61–1.24)
Creatinine, Ser: 0.97 mg/dL (ref 0.61–1.24)
GFR calc Af Amer: >60 mL/min (ref >60)
GFR calc Af Amer: >60 mL/min (ref >60)
GFR calc non Af Amer: >60 mL/min (ref >60)
GFR calc non Af Amer: >60 mL/min (ref >60)
Glucose, Bld: 126 mg/dL — ABNORMAL HIGH (ref 70–99)
Glucose, Bld: 156 mg/dL — ABNORMAL HIGH (ref 70–99)
Potassium: 4.4 mmol/L (ref 3.5–5.1)
Potassium: 4 mmol/L (ref 3.5–5.1)
Sodium: 125 mmol/L — ABNORMAL LOW (ref 135–145)
Sodium: 128 mmol/L — ABNORMAL LOW (ref 135–145)
Total Bilirubin: 1.3 mg/dL — ABNORMAL HIGH (ref 0.3–1.2)
Total Bilirubin: 1.2 mg/dL (ref 0.3–1.2)
Total Protein: 7.8 g/dL (ref 6.5–8.1)
Total Protein: 5.7 g/dL — ABNORMAL LOW (ref 6.5–8.1)

## 2019-04-17 LAB — CBC WITH DIFFERENTIAL/PLATELET
Abs Immature Granulocytes: 0.15 10*3/uL — ABNORMAL HIGH (ref 0.00–0.07)
Basophils Absolute: 0 10*3/uL (ref 0.0–0.1)
Basophils Relative: 0 %
Eosinophils Absolute: 0 10*3/uL (ref 0.0–0.5)
Eosinophils Relative: 0 %
HCT: 41.4 % (ref 39.0–52.0)
Hemoglobin: 14 g/dL (ref 13.0–17.0)
Immature Granulocytes: 1 %
Lymphocytes Relative: 3 %
Lymphs Abs: 0.6 10*3/uL — ABNORMAL LOW (ref 0.7–4.0)
MCH: 27.8 pg (ref 26.0–34.0)
MCHC: 33.8 g/dL (ref 30.0–36.0)
MCV: 82.1 fL (ref 80.0–100.0)
Monocytes Absolute: 0.9 10*3/uL (ref 0.1–1.0)
Monocytes Relative: 4 %
Neutro Abs: 19.9 10*3/uL — ABNORMAL HIGH (ref 1.7–7.7)
Neutrophils Relative %: 92 %
Platelets: 433 10*3/uL — ABNORMAL HIGH (ref 150–400)
RBC: 5.04 MIL/uL (ref 4.22–5.81)
RDW: 14 % (ref 11.5–15.5)
WBC: 21.6 10*3/uL — ABNORMAL HIGH (ref 4.0–10.5)
nRBC: 0 % (ref 0.0–0.2)

## 2019-04-17 LAB — CBC
HCT: 32.3 % — ABNORMAL LOW (ref 39.0–52.0)
Hemoglobin: 10.9 g/dL — ABNORMAL LOW (ref 13.0–17.0)
MCH: 27.5 pg (ref 26.0–34.0)
MCHC: 33.7 g/dL (ref 30.0–36.0)
MCV: 81.4 fL (ref 80.0–100.0)
Platelets: 314 10*3/uL (ref 150–400)
RBC: 3.97 MIL/uL — ABNORMAL LOW (ref 4.22–5.81)
RDW: 13.7 % (ref 11.5–15.5)
WBC: 4.7 10*3/uL (ref 4.0–10.5)
nRBC: 0 % (ref 0.0–0.2)

## 2019-04-17 LAB — POCT I-STAT, CHEM 8
BUN: 21 mg/dL — ABNORMAL HIGH (ref 6–20)
Calcium, Ion: 1.05 mmol/L — ABNORMAL LOW (ref 1.15–1.40)
Chloride: 89 mmol/L — ABNORMAL LOW (ref 98–111)
Creatinine, Ser: 0.9 mg/dL (ref 0.61–1.24)
Glucose, Bld: 133 mg/dL — ABNORMAL HIGH (ref 70–99)
HCT: 42 % (ref 39.0–52.0)
Hemoglobin: 14.3 g/dL (ref 13.0–17.0)
Potassium: 4.5 mmol/L (ref 3.5–5.1)
Sodium: 125 mmol/L — ABNORMAL LOW (ref 135–145)
TCO2: 26 mmol/L (ref 22–32)

## 2019-04-17 LAB — MAGNESIUM: Magnesium: 1.9 mg/dL (ref 1.7–2.4)

## 2019-04-17 LAB — RESPIRATORY PANEL BY RT PCR (FLU A&B, COVID)
Influenza A by PCR: NEGATIVE
Influenza B by PCR: NEGATIVE
SARS Coronavirus 2 by RT PCR: NEGATIVE

## 2019-04-17 LAB — HEMOGLOBIN A1C
Hgb A1c MFr Bld: 5.4 % (ref 4.8–5.6)
Mean Plasma Glucose: 108.28 mg/dL

## 2019-04-17 LAB — PHOSPHORUS: Phosphorus: 4.6 mg/dL (ref 2.5–4.6)

## 2019-04-17 LAB — MRSA PCR SCREENING: MRSA by PCR: NEGATIVE

## 2019-04-17 SURGERY — LAPAROTOMY, EXPLORATORY
Anesthesia: General | Site: Abdomen

## 2019-04-17 MED ORDER — DIPHENHYDRAMINE HCL 12.5 MG/5ML PO ELIX: 12.5000 mg | ORAL_SOLUTION | Freq: Four times a day (QID) | ORAL | Status: DC | PRN

## 2019-04-17 MED ORDER — PANTOPRAZOLE SODIUM 40 MG IV SOLR
40.0000 mg | Freq: Every day | INTRAVENOUS | Status: DC
  Administered 2019-04-17 – 2019-04-21 (×5): 40 mg via INTRAVENOUS
  Filled 2019-04-17 (×5): qty 40

## 2019-04-17 MED ORDER — MIDAZOLAM HCL 2 MG/2ML IJ SOLN
INTRAMUSCULAR | Status: AC
  Filled 2019-04-17: qty 2

## 2019-04-17 MED ORDER — ROCURONIUM BROMIDE 10 MG/ML (PF) SYRINGE
PREFILLED_SYRINGE | INTRAVENOUS | Status: AC
  Filled 2019-04-17: qty 20

## 2019-04-17 MED ORDER — PHENYLEPHRINE 40 MCG/ML (10ML) SYRINGE FOR IV PUSH (FOR BLOOD PRESSURE SUPPORT)
PREFILLED_SYRINGE | INTRAVENOUS | Status: DC | PRN
  Administered 2019-04-17: 120 ug via INTRAVENOUS

## 2019-04-17 MED ORDER — ONDANSETRON HCL 4 MG/2ML IJ SOLN: 4.0000 mg | Freq: Four times a day (QID) | INTRAMUSCULAR | Status: DC | PRN

## 2019-04-17 MED ORDER — ACETAMINOPHEN 325 MG PO TABS: 650.0000 mg | ORAL_TABLET | Freq: Four times a day (QID) | ORAL | Status: DC | PRN

## 2019-04-17 MED ORDER — PROPOFOL 10 MG/ML IV BOLUS
INTRAVENOUS | Status: AC
  Filled 2019-04-17: qty 20

## 2019-04-17 MED ORDER — LIDOCAINE 2% (20 MG/ML) 5 ML SYRINGE
INTRAMUSCULAR | Status: DC | PRN
  Administered 2019-04-17: 100 mg via INTRAVENOUS
  Administered 2019-04-17: 1.5 mg/kg/h via INTRAVENOUS

## 2019-04-17 MED ORDER — MEPERIDINE HCL 50 MG/ML IJ SOLN: 6.2500 mg | INTRAMUSCULAR | Status: DC | PRN

## 2019-04-17 MED ORDER — PROPOFOL 10 MG/ML IV BOLUS
INTRAVENOUS | Status: DC | PRN
  Administered 2019-04-17: 200 mg via INTRAVENOUS

## 2019-04-17 MED ORDER — ALVIMOPAN 12 MG PO CAPS
12.0000 mg | ORAL_CAPSULE | ORAL | Status: AC
  Administered 2019-04-17: 14:00:00 12 mg via ORAL
  Filled 2019-04-17: qty 1

## 2019-04-17 MED ORDER — ATROPINE SULFATE 1 MG/10ML IJ SOSY
PREFILLED_SYRINGE | INTRAMUSCULAR | Status: AC
  Filled 2019-04-17: qty 10

## 2019-04-17 MED ORDER — SODIUM CHLORIDE 0.9 % IV SOLN
2.0000 g | INTRAVENOUS | Status: AC
  Administered 2019-04-17: 2 g via INTRAVENOUS
  Filled 2019-04-17: qty 2

## 2019-04-17 MED ORDER — PROMETHAZINE HCL 25 MG/ML IJ SOLN: 6.2500 mg | INTRAMUSCULAR | Status: DC | PRN

## 2019-04-17 MED ORDER — BUPIVACAINE LIPOSOME 1.3 % IJ SUSP
20.0000 mL | Freq: Once | INTRAMUSCULAR | Status: DC
  Filled 2019-04-17: qty 20

## 2019-04-17 MED ORDER — DEXAMETHASONE SODIUM PHOSPHATE 10 MG/ML IJ SOLN
INTRAMUSCULAR | Status: DC | PRN
  Administered 2019-04-17: 10 mg via INTRAVENOUS

## 2019-04-17 MED ORDER — SUGAMMADEX SODIUM 200 MG/2ML IV SOLN
INTRAVENOUS | Status: DC | PRN
  Administered 2019-04-17: 200 mg via INTRAVENOUS

## 2019-04-17 MED ORDER — LACTATED RINGERS IV SOLN: INTRAVENOUS | Status: DC

## 2019-04-17 MED ORDER — IOPAMIDOL (ISOVUE-300) INJECTION 61%
100.0000 mL | Freq: Once | INTRAVENOUS | Status: AC | PRN
  Administered 2019-04-17: 100 mL via INTRAVENOUS

## 2019-04-17 MED ORDER — ORAL CARE MOUTH RINSE
15.0000 mL | Freq: Two times a day (BID) | OROMUCOSAL | Status: DC
  Administered 2019-04-17 – 2019-04-28 (×21): 15 mL via OROMUCOSAL

## 2019-04-17 MED ORDER — PHENYLEPHRINE HCL-NACL 10-0.9 MG/250ML-% IV SOLN
INTRAVENOUS | Status: DC | PRN
  Administered 2019-04-17: 25 ug/min via INTRAVENOUS

## 2019-04-17 MED ORDER — ACETAMINOPHEN 500 MG PO TABS
1000.0000 mg | ORAL_TABLET | ORAL | Status: AC
  Administered 2019-04-17: 14:00:00 1000 mg via ORAL
  Filled 2019-04-17: qty 2

## 2019-04-17 MED ORDER — ROCURONIUM BROMIDE 10 MG/ML (PF) SYRINGE
PREFILLED_SYRINGE | INTRAVENOUS | Status: DC | PRN
  Administered 2019-04-17: 5 mg via INTRAVENOUS
  Administered 2019-04-17: 15 mg via INTRAVENOUS
  Administered 2019-04-17: 20 mg via INTRAVENOUS
  Administered 2019-04-17: 40 mg via INTRAVENOUS

## 2019-04-17 MED ORDER — FENTANYL CITRATE (PF) 100 MCG/2ML IJ SOLN
INTRAMUSCULAR | Status: DC | PRN
  Administered 2019-04-17 (×2): 25 ug via INTRAVENOUS
  Administered 2019-04-17: 50 ug via INTRAVENOUS

## 2019-04-17 MED ORDER — GABAPENTIN 300 MG PO CAPS
300.0000 mg | ORAL_CAPSULE | ORAL | Status: AC
  Administered 2019-04-17: 14:00:00 300 mg via ORAL
  Filled 2019-04-17: qty 1

## 2019-04-17 MED ORDER — LIDOCAINE 2% (20 MG/ML) 5 ML SYRINGE
INTRAMUSCULAR | Status: AC
  Filled 2019-04-17: qty 20

## 2019-04-17 MED ORDER — MORPHINE SULFATE (PF) 4 MG/ML IV SOLN
2.0000 mg | INTRAVENOUS | Status: DC | PRN
  Administered 2019-04-17 – 2019-04-19 (×17): 2 mg via INTRAVENOUS
  Filled 2019-04-17 (×18): qty 1

## 2019-04-17 MED ORDER — HEPARIN SODIUM (PORCINE) 5000 UNIT/ML IJ SOLN
5000.0000 [IU] | Freq: Once | INTRAMUSCULAR | Status: AC
  Administered 2019-04-17: 14:00:00 5000 [IU] via SUBCUTANEOUS
  Filled 2019-04-17: qty 1

## 2019-04-17 MED ORDER — CHLORHEXIDINE GLUCONATE CLOTH 2 % EX PADS
6.0000 | MEDICATED_PAD | Freq: Every day | CUTANEOUS | Status: DC
  Administered 2019-04-18 – 2019-04-28 (×9): 6 via TOPICAL

## 2019-04-17 MED ORDER — METOPROLOL TARTRATE 5 MG/5ML IV SOLN: 5.0000 mg | Freq: Four times a day (QID) | INTRAVENOUS | Status: DC | PRN

## 2019-04-17 MED ORDER — THIAMINE HCL 100 MG PO TABS
100.0000 mg | ORAL_TABLET | Freq: Every day | ORAL | Status: DC
  Administered 2019-04-20 – 2019-04-29 (×10): 100 mg via ORAL
  Filled 2019-04-17 (×10): qty 1

## 2019-04-17 MED ORDER — PIPERACILLIN-TAZOBACTAM 3.375 G IVPB
3.3750 g | Freq: Three times a day (TID) | INTRAVENOUS | Status: DC
  Administered 2019-04-17 – 2019-04-28 (×32): 3.375 g via INTRAVENOUS
  Filled 2019-04-17 (×31): qty 50

## 2019-04-17 MED ORDER — ONDANSETRON HCL 4 MG/2ML IJ SOLN
INTRAMUSCULAR | Status: AC
  Filled 2019-04-17: qty 4

## 2019-04-17 MED ORDER — THIAMINE HCL 100 MG/ML IJ SOLN
100.0000 mg | Freq: Every day | INTRAMUSCULAR | Status: DC
  Administered 2019-04-17 – 2019-04-19 (×3): 100 mg via INTRAVENOUS
  Filled 2019-04-17 (×4): qty 1
  Filled 2019-04-17: qty 2
  Filled 2019-04-17: qty 1
  Filled 2019-04-17 (×2): qty 2
  Filled 2019-04-17 (×3): qty 1
  Filled 2019-04-17: qty 2
  Filled 2019-04-17: qty 1

## 2019-04-17 MED ORDER — LORAZEPAM 2 MG/ML IJ SOLN
1.0000 mg | INTRAMUSCULAR | Status: AC | PRN
  Administered 2019-04-19: 1 mg via INTRAVENOUS
  Filled 2019-04-17: qty 1

## 2019-04-17 MED ORDER — FOLIC ACID 1 MG PO TABS
1.0000 mg | ORAL_TABLET | Freq: Every day | ORAL | Status: DC
  Administered 2019-04-20 – 2019-04-29 (×10): 1 mg via ORAL
  Filled 2019-04-17 (×12): qty 1

## 2019-04-17 MED ORDER — ADULT MULTIVITAMIN W/MINERALS CH
1.0000 | ORAL_TABLET | Freq: Every day | ORAL | Status: DC
  Administered 2019-04-20 – 2019-04-29 (×10): 1 via ORAL
  Filled 2019-04-17 (×11): qty 1

## 2019-04-17 MED ORDER — OXYCODONE HCL 5 MG PO TABS
5.0000 mg | ORAL_TABLET | ORAL | Status: DC | PRN
  Administered 2019-04-20 (×2): 10 mg via ORAL
  Administered 2019-04-20: 5 mg via ORAL
  Administered 2019-04-21 – 2019-04-24 (×16): 10 mg via ORAL
  Administered 2019-04-24 (×2): 5 mg via ORAL
  Administered 2019-04-25 – 2019-04-29 (×25): 10 mg via ORAL
  Filled 2019-04-17 (×10): qty 2
  Filled 2019-04-17: qty 1
  Filled 2019-04-17 (×27): qty 2
  Filled 2019-04-17: qty 1
  Filled 2019-04-17 (×9): qty 2

## 2019-04-17 MED ORDER — ACETAMINOPHEN 650 MG RE SUPP
650.0000 mg | Freq: Four times a day (QID) | RECTAL | Status: DC | PRN
  Filled 2019-04-17: qty 1

## 2019-04-17 MED ORDER — DEXAMETHASONE SODIUM PHOSPHATE 10 MG/ML IJ SOLN
INTRAMUSCULAR | Status: AC
  Filled 2019-04-17: qty 2

## 2019-04-17 MED ORDER — MIDAZOLAM HCL 5 MG/5ML IJ SOLN
INTRAMUSCULAR | Status: DC | PRN
  Administered 2019-04-17: 1 mg via INTRAVENOUS
  Administered 2019-04-17: 2 mg via INTRAVENOUS
  Administered 2019-04-17 (×2): .5 mg via INTRAVENOUS
  Administered 2019-04-17: 2 mg via INTRAVENOUS

## 2019-04-17 MED ORDER — DIPHENHYDRAMINE HCL 50 MG/ML IJ SOLN: 12.5000 mg | Freq: Four times a day (QID) | INTRAMUSCULAR | Status: DC | PRN

## 2019-04-17 MED ORDER — BUPIVACAINE HCL (PF) 0.25 % IJ SOLN
INTRAMUSCULAR | Status: AC
  Filled 2019-04-17: qty 30

## 2019-04-17 MED ORDER — ENOXAPARIN SODIUM 40 MG/0.4ML ~~LOC~~ SOLN
40.0000 mg | SUBCUTANEOUS | Status: DC
  Administered 2019-04-18 – 2019-04-29 (×11): 40 mg via SUBCUTANEOUS
  Filled 2019-04-17 (×11): qty 0.4

## 2019-04-17 MED ORDER — LORAZEPAM 1 MG PO TABS: 1.0000 mg | ORAL_TABLET | ORAL | Status: AC | PRN

## 2019-04-17 MED ORDER — KETOROLAC TROMETHAMINE 30 MG/ML IJ SOLN: 30.0000 mg | Freq: Once | INTRAMUSCULAR | Status: DC | PRN

## 2019-04-17 MED ORDER — HYDROMORPHONE HCL 1 MG/ML IJ SOLN
0.2500 mg | INTRAMUSCULAR | Status: DC | PRN
  Administered 2019-04-17 (×3): 0.5 mg via INTRAVENOUS

## 2019-04-17 MED ORDER — PHENYLEPHRINE 40 MCG/ML (10ML) SYRINGE FOR IV PUSH (FOR BLOOD PRESSURE SUPPORT)
PREFILLED_SYRINGE | INTRAVENOUS | Status: AC
  Filled 2019-04-17: qty 10

## 2019-04-17 MED ORDER — FENTANYL CITRATE (PF) 100 MCG/2ML IJ SOLN
INTRAMUSCULAR | Status: AC
  Filled 2019-04-17: qty 2

## 2019-04-17 MED ORDER — ALBUMIN HUMAN 5 % IV SOLN
INTRAVENOUS | Status: AC
  Filled 2019-04-17: qty 500

## 2019-04-17 MED ORDER — FENTANYL CITRATE (PF) 250 MCG/5ML IJ SOLN
INTRAMUSCULAR | Status: AC
  Filled 2019-04-17: qty 5

## 2019-04-17 MED ORDER — HYDROMORPHONE HCL 1 MG/ML IJ SOLN
INTRAMUSCULAR | Status: AC
  Administered 2019-04-17: 19:00:00 0.5 mg via INTRAVENOUS
  Filled 2019-04-17: qty 2

## 2019-04-17 MED ORDER — ONDANSETRON HCL 4 MG/2ML IJ SOLN
INTRAMUSCULAR | Status: DC | PRN
  Administered 2019-04-17 (×2): 4 mg via INTRAVENOUS

## 2019-04-17 MED ORDER — SODIUM CHLORIDE 0.9 % IV SOLN
INTRAVENOUS | Status: DC
  Administered 2019-04-17: 1000 mL via INTRAVENOUS

## 2019-04-17 MED ORDER — VASOPRESSIN 20 UNIT/ML IV SOLN
INTRAVENOUS | Status: AC
  Filled 2019-04-17: qty 1

## 2019-04-17 MED ORDER — METHYLENE BLUE 0.5 % INJ SOLN
INTRAVENOUS | Status: AC
  Filled 2019-04-17: qty 10

## 2019-04-17 MED ORDER — CHLORHEXIDINE GLUCONATE 4 % EX LIQD: 1.0000 "application " | Freq: Once | CUTANEOUS | Status: DC

## 2019-04-17 MED ORDER — ACETAMINOPHEN 10 MG/ML IV SOLN
1000.0000 mg | Freq: Four times a day (QID) | INTRAVENOUS | Status: AC
  Administered 2019-04-18 (×3): 1000 mg via INTRAVENOUS
  Filled 2019-04-17 (×3): qty 100

## 2019-04-17 MED ORDER — FENTANYL CITRATE (PF) 100 MCG/2ML IJ SOLN
INTRAMUSCULAR | Status: DC | PRN
  Administered 2019-04-17 (×10): 50 ug via INTRAVENOUS

## 2019-04-17 MED ORDER — SUCCINYLCHOLINE CHLORIDE 200 MG/10ML IV SOSY
PREFILLED_SYRINGE | INTRAVENOUS | Status: DC | PRN
  Administered 2019-04-17: 180 mg via INTRAVENOUS

## 2019-04-17 MED ORDER — 0.9 % SODIUM CHLORIDE (POUR BTL) OPTIME
TOPICAL | Status: DC | PRN
  Administered 2019-04-17: 2000 mL
  Administered 2019-04-17: 6000 mL

## 2019-04-17 MED ORDER — ALBUMIN HUMAN 5 % IV SOLN: INTRAVENOUS | Status: DC | PRN

## 2019-04-17 MED ORDER — ONDANSETRON 4 MG PO TBDP: 4.0000 mg | ORAL_TABLET | Freq: Four times a day (QID) | ORAL | Status: DC | PRN

## 2019-04-17 MED ORDER — METHOCARBAMOL 1000 MG/10ML IJ SOLN
1000.0000 mg | Freq: Three times a day (TID) | INTRAVENOUS | Status: DC
  Administered 2019-04-17 – 2019-04-24 (×20): 1000 mg via INTRAVENOUS
  Filled 2019-04-17: qty 10
  Filled 2019-04-17: qty 1000
  Filled 2019-04-17: qty 10
  Filled 2019-04-17: qty 1000
  Filled 2019-04-17 (×2): qty 10
  Filled 2019-04-17: qty 1000
  Filled 2019-04-17 (×7): qty 10
  Filled 2019-04-17: qty 1000
  Filled 2019-04-17 (×6): qty 10
  Filled 2019-04-17: qty 1000
  Filled 2019-04-17: qty 10
  Filled 2019-04-17 (×2): qty 1000

## 2019-04-17 MED ORDER — EPHEDRINE 5 MG/ML INJ
INTRAVENOUS | Status: AC
  Filled 2019-04-17: qty 10

## 2019-04-17 MED ORDER — PHENYLEPHRINE HCL (PRESSORS) 10 MG/ML IV SOLN
INTRAVENOUS | Status: AC
  Filled 2019-04-17: qty 1

## 2019-04-17 MED ORDER — SUCCINYLCHOLINE CHLORIDE 200 MG/10ML IV SOSY
PREFILLED_SYRINGE | INTRAVENOUS | Status: AC
  Filled 2019-04-17: qty 30

## 2019-04-17 SURGICAL SUPPLY — 53 items
CANISTER WOUND CARE 500ML ATS (WOUND CARE) ×2 IMPLANT
CHLORAPREP W/TINT 26 (MISCELLANEOUS) ×3 IMPLANT
COVER MAYO STAND STRL (DRAPES) ×3 IMPLANT
COVER SURGICAL LIGHT HANDLE (MISCELLANEOUS) ×3 IMPLANT
COVER WAND RF STERILE (DRAPES) IMPLANT
DRAPE LAPAROSCOPIC ABDOMINAL (DRAPES) ×3 IMPLANT
DRAPE UTILITY XL STRL (DRAPES) ×3 IMPLANT
DRAPE WARM FLUID 44X44 (DRAPES) ×3 IMPLANT
DRSG OPSITE POSTOP 4X10 (GAUZE/BANDAGES/DRESSINGS) IMPLANT
DRSG OPSITE POSTOP 4X6 (GAUZE/BANDAGES/DRESSINGS) IMPLANT
DRSG OPSITE POSTOP 4X8 (GAUZE/BANDAGES/DRESSINGS) IMPLANT
DRSG VAC ATS LRG SENSATRAC (GAUZE/BANDAGES/DRESSINGS) ×2 IMPLANT
ELECT BLADE TIP CTD 4 INCH (ELECTRODE) ×2 IMPLANT
ELECT REM PT RETURN 15FT ADLT (MISCELLANEOUS) ×3 IMPLANT
GLOVE BIOGEL PI IND STRL 7.0 (GLOVE) ×1 IMPLANT
GLOVE BIOGEL PI INDICATOR 7.0 (GLOVE) ×2
GLOVE SURG SS PI 7.0 STRL IVOR (GLOVE) ×3 IMPLANT
GOWN STRL REUS W/TWL LRG LVL3 (GOWN DISPOSABLE) ×3 IMPLANT
GOWN STRL REUS W/TWL XL LVL3 (GOWN DISPOSABLE) ×3 IMPLANT
HANDLE SUCTION POOLE (INSTRUMENTS) ×1 IMPLANT
KIT BASIN OR (CUSTOM PROCEDURE TRAY) ×3 IMPLANT
KIT TURNOVER KIT A (KITS) IMPLANT
LIGASURE IMPACT 36 18CM CVD LR (INSTRUMENTS) ×2 IMPLANT
PACK GENERAL/GYN (CUSTOM PROCEDURE TRAY) ×3 IMPLANT
PENCIL SMOKE EVACUATOR (MISCELLANEOUS) IMPLANT
RELOAD PROXIMATE 100 BLUE (MISCELLANEOUS) IMPLANT
RELOAD PROXIMATE 100MM BLUE (MISCELLANEOUS)
RELOAD PROXIMATE 75MM BLUE (ENDOMECHANICALS) ×12 IMPLANT
RELOAD STAPLE 100 3.8 BLU REG (MISCELLANEOUS) IMPLANT
RELOAD STAPLE 75 3.8 BLU REG (ENDOMECHANICALS) IMPLANT
SPONGE LAP 18X18 RF (DISPOSABLE) IMPLANT
STAPLER CUT CVD 40MM BLUE (STAPLE) ×2 IMPLANT
STAPLER CUT RELOAD BLUE (STAPLE) ×4 IMPLANT
STAPLER PROXIMATE 100MM BLUE (MISCELLANEOUS) IMPLANT
STAPLER PROXIMATE 75MM BLUE (STAPLE) ×2 IMPLANT
STAPLER VISISTAT 35W (STAPLE) ×3 IMPLANT
SUCTION POOLE HANDLE (INSTRUMENTS) ×3
SUT ETHILON 2 0 PS N (SUTURE) ×4 IMPLANT
SUT MNCRL AB 4-0 PS2 18 (SUTURE) IMPLANT
SUT PDS AB 0 CT1 36 (SUTURE) ×4 IMPLANT
SUT PROLENE 2 0 SH DA (SUTURE) ×4 IMPLANT
SUT SILK 2 0 (SUTURE) ×4
SUT SILK 2 0 SH CR/8 (SUTURE) ×3 IMPLANT
SUT SILK 2-0 18XBRD TIE 12 (SUTURE) ×1 IMPLANT
SUT SILK 3 0 (SUTURE) ×4
SUT SILK 3 0 SH CR/8 (SUTURE) ×3 IMPLANT
SUT SILK 3-0 18XBRD TIE 12 (SUTURE) ×1 IMPLANT
SUT VIC AB 3-0 SH 8-18 (SUTURE) ×2 IMPLANT
TOWEL OR 17X26 10 PK STRL BLUE (TOWEL DISPOSABLE) ×3 IMPLANT
TOWEL OR NON WOVEN STRL DISP B (DISPOSABLE) ×3 IMPLANT
TRAY FOLEY MTR SLVR 14FR STAT (SET/KITS/TRAYS/PACK) ×3 IMPLANT
TRAY FOLEY MTR SLVR 16FR STAT (SET/KITS/TRAYS/PACK) ×3 IMPLANT
YANKAUER SUCT BULB TIP NO VENT (SUCTIONS) ×3 IMPLANT

## 2019-04-17 NOTE — Anesthesia Procedure Notes (Addendum)
Procedure Name: Intubation Date/Time: 04/17/2019 3:08 PM Performed by: Silas Sacramento, CRNA Pre-anesthesia Checklist: Patient identified, Emergency Drugs available, Suction available and Patient being monitored Patient Re-evaluated:Patient Re-evaluated prior to induction Oxygen Delivery Method: Circle system utilized Preoxygenation: Pre-oxygenation with 100% oxygen Induction Type: IV induction and Rapid sequence Laryngoscope Size: Glidescope and 3 Grade View: Grade I Tube type: Oral Tube size: 7.5 mm Number of attempts: 1 Airway Equipment and Method: Video-laryngoscopy and Rigid stylet Placement Confirmation: ETT inserted through vocal cords under direct vision,  positive ETCO2 and breath sounds checked- equal and bilateral Secured at: 22 cm Tube secured with: Tape Dental Injury: Teeth and Oropharynx as per pre-operative assessment  Comments: VL used due to unknown pt airway assessment while waiting for Covid results. Pt with favorable airway characteristics for DL.

## 2019-04-17 NOTE — Plan of Care (Signed)
Patient admitted to room 1235.  Alert and oriented.  VSS.  Pain well controlled with current PRN management.  Call bell within reach, bed alarm on.

## 2019-04-17 NOTE — Transfer of Care (Signed)
Immediate Anesthesia Transfer of Care Note  Patient: Daniel Mcmillan  Procedure(s) Performed: EXPLORATORY LAPAROTOMY; RIGHT COLECTOMY WITH END ILEOSTOMY; LOW ANTERIOR RESECTION WITH END COLOSTOMY (N/A Abdomen)  Patient Location: PACU  Anesthesia Type:General  Level of Consciousness: awake and alert   Airway & Oxygen Therapy: Patient Spontanous Breathing and Patient connected to face mask oxygen  Post-op Assessment: Report given to RN and Post -op Vital signs reviewed and stable  Post vital signs: Reviewed and stable  Last Vitals:  Vitals Value Taken Time  BP 159/94 04/17/19 1838  Temp    Pulse 116 04/17/19 1848  Resp 26 04/17/19 1848  SpO2 98 % 04/17/19 1848  Vitals shown include unvalidated device data.  Last Pain:  Vitals:   04/17/19 1407  TempSrc: Oral         Complications: No apparent anesthesia complications

## 2019-04-17 NOTE — Anesthesia Procedure Notes (Signed)
Date/Time: 04/17/2019 6:20 PM Performed by: Cynda Familia, CRNA Oxygen Delivery Method: Simple face mask Placement Confirmation: positive ETCO2 and breath sounds checked- equal and bilateral Dental Injury: Teeth and Oropharynx as per pre-operative assessment

## 2019-04-17 NOTE — Anesthesia Preprocedure Evaluation (Addendum)
Anesthesia Evaluation  Patient identified by MRN, date of birth, ID band Patient awake    Reviewed: Allergy & Precautions, NPO status , Patient's Chart, lab work & pertinent test results  Airway Mallampati: I       Dental no notable dental hx. (+) Teeth Intact   Pulmonary neg pulmonary ROS,    Pulmonary exam normal breath sounds clear to auscultation       Cardiovascular hypertension, Normal cardiovascular exam Rhythm:Regular Rate:Normal     Neuro/Psych negative neurological ROS  negative psych ROS   GI/Hepatic Neg liver ROS,   Endo/Other  negative endocrine ROS  Renal/GU negative Renal ROS  negative genitourinary   Musculoskeletal negative musculoskeletal ROS (+)   Abdominal Normal abdominal exam  (+)   Peds negative pediatric ROS (+)  Hematology negative hematology ROS (+)   Anesthesia Other Findings   Reproductive/Obstetrics negative OB ROS                             Anesthesia Physical Anesthesia Plan  ASA: II  Anesthesia Plan: General   Post-op Pain Management:    Induction: Intravenous  PONV Risk Score and Plan: 4 or greater and Ondansetron, Dexamethasone and Midazolam  Airway Management Planned: Oral ETT  Additional Equipment: None  Intra-op Plan:   Post-operative Plan: Extubation in OR  Informed Consent: I have reviewed the patients History and Physical, chart, labs and discussed the procedure including the risks, benefits and alternatives for the proposed anesthesia with the patient or authorized representative who has indicated his/her understanding and acceptance.     Dental advisory given  Plan Discussed with: CRNA  Anesthesia Plan Comments:        Anesthesia Quick Evaluation

## 2019-04-17 NOTE — Op Note (Addendum)
Preoperative diagnosis: pneumoperitoneum  Postoperative diagnosis: sigmoid stricture, perforated cecum, feculent contamination  Procedure: right colectomy with end ileostomy, sigmoid colectomy with end colostomy  Surgeon: Gurney Maxin, M.D.  Asst: Alferd Apa, Neysa Bonito Due to complexity of the disease, dissection, decision making and retraction needs, Dr. Johney Maine was needed for completion of the case  Anesthesia: general anesthesia  Indications for procedure: Daniel Mcmillan is a 49 y.o. year old male with symptoms of abdominal pain, bloating, and malaise. 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 He was found on outpatient CT scan to have a large amount of free air. He urgently came to Surgery admissions for operation.  Description of procedure: The patient was brought into the operative suite. Anesthesia was administered with General endotracheal anesthesia. WHO checklist was applied. The patient was then placed in supine position. The area was prepped and draped in the usual sterile fashion.  Next, a lower midline incision was made. Cautery was used to dissect down to the through the subcutaneous tissue and fascia in the midline. Upon entry there was a large amount of feculent contamination, dilation of intestines, multiple acute adhesions of the small intestine. These adhesions were easily broken up with blunt dissection. The feculent contamination was seen in the abdomen coming from the cecum.  The cecum was mobilized by incising the white line of Toldt as well as taking down the hepatic flexure.  The middle colic vessels were identified and the colon was divided just proximal to this.  The mesentery was taken with LigaSure device and terminal ileum was identified and divided.  Next, attention was turned towards the sigmoid sigmoid was densely adhered to the abdominal wall as well as sidewall as well as pelvis.  Also there are multiple loops of small  bowel adhered to the sigmoid colon this was taken down with sharp dissection as well as blunt dissection.  There was a loop of ileum that turned out to be the terminal ileum going down to the pelvis that is densely adhered as well this was freed with sharp dissection as well as freeing up part of the peritoneum which allowed visualization of the right ureter and allowed [place] to find a plane that did free up this terminal ileum.  Next, due to dense adhesions Dr. Johney Maine was asked to come in and help assistance.  The left colon appeared healthy and mobile there was dense adherence of the mesocolon and sigmoid colon to the abdominal wall this required large round of blunt and sharp dissection.  Left ureter was identified left in place.  The sigmoid colon was finally freed from the sidewall as well as the bladder.  There did not appear to be a direct connection to the bladder there were multiple diverticula directly upon the bladder that were entered during the dissection.  There were finally able to reach the rectosigmoid junction taking the mesentery with cautery.  Rectosigmoid junction was transected with a blue load contour stapler.  Healthy portion of left colon was chosen and divided with a 75 mm GIA blue load stapler.  The rectal stump was sutured with two 2-0 Prolene's to mark.  The proximal transverse colon was sutured with two 2-0 Prolene's to mark.  The abdomen was irrigated with 6 L of warm saline.  Due to the amount of contamination as well as inflammation of the proximal colon, decision was made not to create an anastomosis in this field but rather to bring out an end ileostomy and  a mucous fistula of the distal left colon.  In examining the terminal ileum due to the adhesiolysis that had occurred an additional 12 cm of terminal ileum was resected with a 75 mm GIA blue load stapler.   Due to concern of colovesical fistula, methylene blue was infused into the bladder via the foley catheter. The bladder  distended but there was no evidence of leakage.  1 82 [place] Blake drain was placed in the right abdomen and the tip was placed along the right paracolic gutter and liver.  1 19 [place] Blake drain was placed in the left abdomen with the tip going down into the pelvis.  Next a right upper quadrant circular incision was made cautery was used to dissect down through the subcutaneous tissues fascia was entered in a cruciate incision the rectus fibers were bluntly dissected and the posterior fascia was entered ileum was brought up through this area.  Next, a left upper quadrant circular incision was made.  Cautery was used to dissect down through subcutaneous tissues and the fascia was entered in a cruciate incision.  The rectus fibers were bluntly dissected posterior fascia was entered and the distal left colon was brought up through this wound.  The midline incision was then closed with 0 PDS in running fashion.  The mucous fistula was matured with 3-0 Vicryls.  The ileostomy was matured with 3 cm of Brooking with 3-0 Vicryl's.  A black sponge wound VAC was put in place in the midline wound.  Patient then awoke from anesthesia was brought to PACU in stable condition.  All counts were correct.  Findings: perforated cecum, dense adhesions and chronic inflammation of the sigmoid colon, feculent peritonitis  Specimen: right colon, ileum stitch marks proximal, sigmoid colon stitch marks proximal  Implant: right drain to right colic gutter and liver, left drain to pelvis, wound vac, right end ileostomy, left sided end colostomy (mucous fistula)   Blood loss: 150 ml  Local anesthesia: none  Complications: none  Gurney Maxin, M.D. General, Bariatric, & Minimally Invasive Surgery [facility]

## 2019-04-17 NOTE — H&P (Signed)
Daniel Mcmillan is an 49 y.o. male.  HPI: 49 yo male had lower abdominal pain over the last 4 days. 2 days ago he went to the ER but was not seen after several hours so left. He was in contact with a [facility] surgeon who started antibiotics and ordered a CT scan today. This was completed showing large amount of free air as well as multiple abscesses. He also reports more air and sediment in his urine. He has felt more bloating and similar pains to past diverticulitis.  Past Medical History:  Diagnosis Date  . Diverticulosis     Past Surgical History:  Procedure Laterality Date  . HERNIA REPAIR    . IR RADIOLOGIST EVAL & MGMT  05/21/2017    No family history on file.  Social History:  reports that he has never smoked. He has never used smokeless tobacco. He reports current alcohol use of about 5.0 standard drinks of alcohol per week. He reports current drug use. Drug: Marijuana.  Allergies: No Known Allergies  Medications: I have reviewed the patient's current medications.  No results found for this or any previous visit (from the past 48 hour(s)).  CT ABDOMEN PELVIS W CONTRAST  Result Date: 04/17/2019 CLINICAL DATA:  Abdominal bloating and pain. Symptoms for 1 day. History of inguinal herniorrhaphy. History of diverticulitis, currently on antibiotics, started 2 days ago. EXAM: CT ABDOMEN AND PELVIS WITH CONTRAST TECHNIQUE: Multidetector CT imaging of the abdomen and pelvis was performed using the standard protocol following bolus administration of intravenous contrast. CONTRAST:  144mL ISOVUE-300 IOPAMIDOL (ISOVUE-300) INJECTION 61% COMPARISON:  01/29/2018. FINDINGS: Lower chest: Linear opacity at the right lung base consistent with atelectasis. Heart normal in size. Hepatobiliary: 2 small right lobe low-density liver lesions consistent with cysts, stable. No other liver abnormality. Normal gallbladder. No bile duct dilation. Pancreas: Unremarkable. No pancreatic ductal dilatation or  surrounding inflammatory changes. Spleen: Normal in size without focal abnormality. Adrenals/Urinary Tract: 1.7 cm left adrenal nodule, stable consistent with an adenoma. Normal right adrenal gland. Kidneys normal in size, orientation and position with symmetric enhancement and excretion. No masses, stones or hydronephrosis. Ureters are unremarkable. Bladder is unremarkable. Stomach/Bowel/peritoneal cavity: There is free intraperitoneal air as well as ascites. There is also right retroperitoneal air above the right kidney, under the posterior right hemidiaphragm. Air and dependent fluid collects adjacent to the liver with nondependent air along the anterior upper upper abdomen. In the pelvis, there are fluid collections, 1 on the right measuring 6 x 5.3 x 6 cm. A smaller on the left measuring 2.6 x 1.7 cm transversely. Additional fluid in the right posterior pelvis may reflect additional collection or be within pelvic small bowel. Apparent eccentric wall thickening along the mid sigmoid colon, poorly defined with hazy opacity in the adjacent fat. There are diverticula along this portion of the sigmoid colon. Mild distention of the cecum and lower ascending colon. Remainder of the colon is mostly decompressed. Appendix not defined on this exam. Mild dilation of loops of small bowel. Maximum dilation 4.3 cm. No convincing small bowel wall thickening or inflammation. Stomach is mostly decompressed but otherwise unremarkable. Vascular/Lymphatic: Minimal atherosclerosis at the aortic bifurcation. Atherosclerosis noted in the iliac arteries without stenosis. No discrete enlarged lymph nodes. Reproductive: No acute or significant abnormality. Other: No hernia. Musculoskeletal: No acute or significant osseous findings. IMPRESSION: 1. Perforated viscus. There is significant free intraperitoneal air as well as ascites, which creates air-fluid levels. There is a smaller amount of right-sided retroperitoneal  air. The site of  perforation is most likely a perforated sigmoid diverticulum in the setting of diverticulitis. Sigmoid colon shows eccentric wall thickening and adjacent inflammation. 2. There are also pelvic fluid collections concerning for abscesses. 3. My small-bowel dilation most likely due to adynamic ileus. 4. No other acute finding within the abdomen or pelvis. Electronically Signed   By: Lajean Manes M.D.   On: 04/17/2019 12:32    Review of Systems  Constitutional: Positive for malaise/fatigue. Negative for chills and fever.  HENT: Negative for hearing loss.   Eyes: Negative for blurred vision and double vision.  Respiratory: Negative for cough and hemoptysis.   Cardiovascular: Negative for chest pain and palpitations.  Gastrointestinal: Positive for abdominal pain and nausea. Negative for vomiting.  Genitourinary: Negative for dysuria and urgency.  Musculoskeletal: Negative for myalgias and neck pain.  Skin: Negative for itching and rash.  Neurological: Negative for dizziness, tingling and headaches.  Endo/Heme/Allergies: Does not bruise/bleed easily.  Psychiatric/Behavioral: Negative for depression and suicidal ideas.   Blood pressure (!) 151/103, pulse (!) 103, temperature 98.4 F (36.9 C), temperature source Oral, resp. rate (!) 24, SpO2 98 %. Physical Exam  Vitals reviewed. Constitutional: He is oriented to person, place, and time. He appears well-developed and well-nourished.  HENT:  Head: Normocephalic and atraumatic.  Eyes: Pupils are equal, round, and reactive to light. Conjunctivae and EOM are normal.  Cardiovascular: Normal rate and regular rhythm.  Respiratory: Effort normal and breath sounds normal.  GI: Soft. He exhibits distension. There is abdominal tenderness. There is guarding.  Musculoskeletal:        General: Normal range of motion.     Cervical back: Normal range of motion and neck supple.  Neurological: He is alert and oriented to person, place, and time.  Skin: Skin is  warm and dry.  Psychiatric: He has a normal mood and affect. His behavior is normal.     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 Recent CT scan shows large amount of free air, inflammation of the sigmoid colon with diverticulosis, multiple abscesses. -IV abx -plan for exploratory laparotomy with likely colon resection and colostomy creation. Details of the surgery were discussed including wound infection, bleeding, abscess, ileus, pneumonia, reversible creation of colostomy, prolonged hospitalization. All questions were answered. He agreed to proceed with surgery. -inpatient admission  Arta Bruce Tuvia Woodrick 04/17/2019, 2:13 PM

## 2019-04-18 LAB — CBC
HCT: 38.5 % — ABNORMAL LOW (ref 39.0–52.0)
Hemoglobin: 13.5 g/dL (ref 13.0–17.0)
MCH: 27.8 pg (ref 26.0–34.0)
MCHC: 35.1 g/dL (ref 30.0–36.0)
MCV: 79.4 fL — ABNORMAL LOW (ref 80.0–100.0)
Platelets: 377 10*3/uL (ref 150–400)
RBC: 4.85 MIL/uL (ref 4.22–5.81)
RDW: 14 % (ref 11.5–15.5)
WBC: 14.7 10*3/uL — ABNORMAL HIGH (ref 4.0–10.5)
nRBC: 0 % (ref 0.0–0.2)

## 2019-04-18 LAB — BASIC METABOLIC PANEL
Anion gap: 12 (ref 5–15)
BUN: 19 mg/dL (ref 6–20)
CO2: 22 mmol/L (ref 22–32)
Calcium: 7.6 mg/dL — ABNORMAL LOW (ref 8.9–10.3)
Chloride: 94 mmol/L — ABNORMAL LOW (ref 98–111)
Creatinine, Ser: 0.83 mg/dL (ref 0.61–1.24)
GFR calc Af Amer: >60 mL/min (ref >60)
GFR calc non Af Amer: >60 mL/min (ref >60)
Glucose, Bld: 141 mg/dL — ABNORMAL HIGH (ref 70–99)
Potassium: 4.5 mmol/L (ref 3.5–5.1)
Sodium: 128 mmol/L — ABNORMAL LOW (ref 135–145)

## 2019-04-18 LAB — PREALBUMIN: Prealbumin: <5 mg/dL — ABNORMAL LOW (ref 18–38)

## 2019-04-18 NOTE — Progress Notes (Signed)
1 Day Post-Op   Subjective/Chief Complaint: Complains of soreness but feels better than when he came in   Objective: Vital signs in last 24 hours: Temp:  [97.3 F (36.3 C)-99.1 F (37.3 C)] 97.9 F (36.6 C) (01/16 0331) Pulse Rate:  [103-118] 107 (01/15 1930) Resp:  [17-28] 22 (01/16 0600) BP: (115-167)/(76-103) 124/80 (01/16 0600) SpO2:  [94 %-100 %] 100 % (01/16 0600) Weight:  [66.7 kg] 66.7 kg (01/15 1448)    Intake/Output from previous day: 01/15 0701 - 01/16 0700 In: 6048.7 [P.O.:210; I.V.:4774.7; IV Piggyback:989.1] Out: 1885 [id number]; Emesis/NG output:200; Drains:375; Blood:250] Intake/Output this shift: No intake/output data recorded.  General appearance: alert and cooperative Resp: clear to auscultation bilaterally Cardio: regular rate and rhythm GI: soft, appropriately tender. distended. ostomies pink with no output. vac in place. drain output serosanguinous  Lab Results:  Recent Labs    04/17/19 1905 04/18/19 0115  WBC 4.7 14.7*  HGB 10.9* 13.5  HCT 32.3* 38.5*  PLT 314 377   BMET Recent Labs    04/17/19 1905 04/18/19 0115  NA 128* 128*  K 4.0 4.5  CL 93* 94*  CO2 24 22  GLUCOSE 156* 141*  BUN 21* 19  CREATININE 0.97 0.83  CALCIUM 7.7* 7.6*   PT/INR No results for input(s): LABPROT, INR in the last 72 hours. ABG No results for input(s): PHART, HCO3 in the last 72 hours.  Invalid input(s): PCO2, PO2  Studies/Results: CT ABDOMEN PELVIS W CONTRAST  Result Date: 04/17/2019 CLINICAL DATA:  Abdominal bloating and pain. Symptoms for 1 day. History of inguinal herniorrhaphy. History of diverticulitis, currently on antibiotics, started 2 days ago. EXAM: CT ABDOMEN AND PELVIS WITH CONTRAST TECHNIQUE: Multidetector CT imaging of the abdomen and pelvis was performed using the standard protocol following bolus administration of intravenous contrast. CONTRAST:  165mL ISOVUE-300 IOPAMIDOL (ISOVUE-300) INJECTION 61% COMPARISON:  01/29/2018. FINDINGS:  Lower chest: Linear opacity at the right lung base consistent with atelectasis. Heart normal in size. Hepatobiliary: 2 small right lobe low-density liver lesions consistent with cysts, stable. No other liver abnormality. Normal gallbladder. No bile duct dilation. Pancreas: Unremarkable. No pancreatic ductal dilatation or surrounding inflammatory changes. Spleen: Normal in size without focal abnormality. Adrenals/Urinary Tract: 1.7 cm left adrenal nodule, stable consistent with an adenoma. Normal right adrenal gland. Kidneys normal in size, orientation and position with symmetric enhancement and excretion. No masses, stones or hydronephrosis. Ureters are unremarkable. Bladder is unremarkable. Stomach/Bowel/peritoneal cavity: There is free intraperitoneal air as well as ascites. There is also right retroperitoneal air above the right kidney, under the posterior right hemidiaphragm. Air and dependent fluid collects adjacent to the liver with nondependent air along the anterior upper upper abdomen. In the pelvis, there are fluid collections, 1 on the right measuring 6 x 5.3 x 6 cm. A smaller on the left measuring 2.6 x 1.7 cm transversely. Additional fluid in the right posterior pelvis may reflect additional collection or be within pelvic small bowel. Apparent eccentric wall thickening along the mid sigmoid colon, poorly defined with hazy opacity in the adjacent fat. There are diverticula along this portion of the sigmoid colon. Mild distention of the cecum and lower ascending colon. Remainder of the colon is mostly decompressed. Appendix not defined on this exam. Mild dilation of loops of small bowel. Maximum dilation 4.3 cm. No convincing small bowel wall thickening or inflammation. Stomach is mostly decompressed but otherwise unremarkable. Vascular/Lymphatic: Minimal atherosclerosis at the aortic bifurcation. Atherosclerosis noted in the iliac arteries without stenosis. No discrete enlarged  lymph nodes. Reproductive:  No acute or significant abnormality. Other: No hernia. Musculoskeletal: No acute or significant osseous findings. IMPRESSION: 1. Perforated viscus. There is significant free intraperitoneal air as well as ascites, which creates air-fluid levels. There is a smaller amount of right-sided retroperitoneal air. The site of perforation is most likely a perforated sigmoid diverticulum in the setting of diverticulitis. Sigmoid colon shows eccentric wall thickening and adjacent inflammation. 2. There are also pelvic fluid collections concerning for abscesses. 3. My small-bowel dilation most likely due to adynamic ileus. 4. No other acute finding within the abdomen or pelvis. Electronically Signed   By: Lajean Manes M.D.   On: 04/17/2019 12:32    Anti-infectives: Anti-infectives (From admission, onward)   Start     Dose/Rate Route Frequency Ordered Stop   04/17/19 2200  piperacillin-tazobactam (ZOSYN) IVPB 3.375 g     3.375 g 12.5 mL/hr over 240 Minutes Intravenous Every 8 hours 04/17/19 1825     04/17/19 1400  cefoTEtan (CEFOTAN) 2 g in sodium chloride 0.9 % 100 mL IVPB     2 g 200 mL/hr over 30 Minutes Intravenous On call to O.R. 04/17/19 1349 04/17/19 1520      Assessment/Plan: s/p Procedure(s): EXPLORATORY LAPAROTOMY; RIGHT COLECTOMY WITH END ILEOSTOMY; LOW ANTERIOR RESECTION WITH END COLOSTOMY (N/A) Continue ng and bowel rest for ileus  Continue zosyn day 2 for fecal contamination Continue foley because of bladder repair Continue to monitor in icu  LOS: 1 day    Daniel Mcmillan 04/18/2019

## 2019-04-18 NOTE — Anesthesia Postprocedure Evaluation (Signed)
Anesthesia Post Note  Patient: Ikaika Bhatia  Procedure(s) Performed: EXPLORATORY LAPAROTOMY; RIGHT COLECTOMY WITH END ILEOSTOMY; LOW ANTERIOR RESECTION WITH END COLOSTOMY (N/A Abdomen)     Patient location during evaluation: PACU Anesthesia Type: General Level of consciousness: awake and alert Pain management: pain level controlled Vital Signs Assessment: post-procedure vital signs reviewed and stable Respiratory status: spontaneous breathing, nonlabored ventilation, respiratory function stable and patient connected to nasal cannula oxygen Cardiovascular status: blood pressure returned to baseline and stable Postop Assessment: no apparent nausea or vomiting Anesthetic complications: no    Last Vitals:  Vitals:   04/18/19 1600 04/18/19 1700  BP: 127/82 136/87  Pulse:    Resp: (!) 24 (!) 24  Temp:  (!) 36.4 C  SpO2: 100% 100%    Last Pain:  Vitals:   04/18/19 1700  TempSrc: Axillary  PainSc:    Pain Goal:                   Lidia Collum

## 2019-04-19 DIAGNOSIS — Z932 Ileostomy status: Secondary | ICD-10-CM

## 2019-04-19 DIAGNOSIS — Z934 Other artificial openings of gastrointestinal tract status: Secondary | ICD-10-CM

## 2019-04-19 DIAGNOSIS — K658 Other peritonitis: Secondary | ICD-10-CM

## 2019-04-19 DIAGNOSIS — E43 Unspecified severe protein-calorie malnutrition: Secondary | ICD-10-CM

## 2019-04-19 DIAGNOSIS — K56699 Other intestinal obstruction unspecified as to partial versus complete obstruction: Secondary | ICD-10-CM

## 2019-04-19 DIAGNOSIS — I10 Essential (primary) hypertension: Secondary | ICD-10-CM

## 2019-04-19 DIAGNOSIS — N321 Vesicointestinal fistula: Secondary | ICD-10-CM

## 2019-04-19 DIAGNOSIS — K56609 Unspecified intestinal obstruction, unspecified as to partial versus complete obstruction: Secondary | ICD-10-CM

## 2019-04-19 DIAGNOSIS — F1020 Alcohol dependence, uncomplicated: Secondary | ICD-10-CM | POA: Diagnosis present

## 2019-04-19 DIAGNOSIS — K3532 Acute appendicitis with perforation and localized peritonitis, without abscess: Secondary | ICD-10-CM

## 2019-04-19 DIAGNOSIS — K5790 Diverticulosis of intestine, part unspecified, without perforation or abscess without bleeding: Secondary | ICD-10-CM | POA: Diagnosis present

## 2019-04-19 HISTORY — DX: Essential (primary) hypertension: I10

## 2019-04-19 LAB — BASIC METABOLIC PANEL
Anion gap: 13 (ref 5–15)
BUN: 31 mg/dL — ABNORMAL HIGH (ref 6–20)
CO2: 22 mmol/L (ref 22–32)
Calcium: 7.6 mg/dL — ABNORMAL LOW (ref 8.9–10.3)
Chloride: 98 mmol/L (ref 98–111)
Creatinine, Ser: 1.11 mg/dL (ref 0.61–1.24)
GFR calc Af Amer: >60 mL/min (ref >60)
GFR calc non Af Amer: >60 mL/min (ref >60)
Glucose, Bld: 120 mg/dL — ABNORMAL HIGH (ref 70–99)
Potassium: 4.1 mmol/L (ref 3.5–5.1)
Sodium: 133 mmol/L — ABNORMAL LOW (ref 135–145)

## 2019-04-19 LAB — CBC
HCT: 29.4 % — ABNORMAL LOW (ref 39.0–52.0)
Hemoglobin: 10.4 g/dL — ABNORMAL LOW (ref 13.0–17.0)
MCH: 27.7 pg (ref 26.0–34.0)
MCHC: 35.4 g/dL (ref 30.0–36.0)
MCV: 78.4 fL — ABNORMAL LOW (ref 80.0–100.0)
Platelets: 372 10*3/uL (ref 150–400)
RBC: 3.75 MIL/uL — ABNORMAL LOW (ref 4.22–5.81)
RDW: 13.6 % (ref 11.5–15.5)
WBC: 19.8 10*3/uL — ABNORMAL HIGH (ref 4.0–10.5)
nRBC: 0 % (ref 0.0–0.2)

## 2019-04-19 MED ORDER — MORPHINE SULFATE (PF) 2 MG/ML IV SOLN
2.0000 mg | INTRAVENOUS | Status: DC | PRN
  Administered 2019-04-19 – 2019-04-21 (×14): 2 mg via INTRAVENOUS
  Filled 2019-04-19 (×14): qty 1

## 2019-04-19 MED ORDER — PHENOL 1.4 % MT LIQD
1.0000 | OROMUCOSAL | Status: DC | PRN
  Filled 2019-04-19: qty 177

## 2019-04-19 NOTE — Progress Notes (Signed)
Initial Nutrition Assessment  INTERVENTION:   If pt unable to have diet advanced by 1/22 (LOS x 7), recommend nutrition support.  Once diet advanced: -Ensure Enlive po BID, each supplement provides 350 kcal and 20 grams of protein  NUTRITION DIAGNOSIS:   Increased nutrient needs related to post-op healing as evidenced by estimated needs.  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 Recent CT scan shows large amount of free air, inflammation of the sigmoid colon with diverticulosis, multiple abscesses.  1/15: s/p ex lap, right colectomy with end ileostomy, sigmoid colectomy with end colostomy  **RD working remotely**  Patient reports having abdominal pain and nausea for 5 days PTA. Pt drinks ETOH and uses marijuana most days.  Per surgery note, pt to remain on bowel rest, NPO with NGT d/t ileus.  Pt had a wound vac but it was malfunctioning so it was removed.  Per weight records, no recent weights noted PTA. Last recorded weight PTA was from February 2019. Weight has decreased since that time.  I/Os: +6.8L since admit UOP: 1195 ml x 24 hrs NGT OP: 300 ml JP drains: 155 ml  Medications: Folic acid tablet, Multivitamin with minerals daily, IV Thiamine Labs reviewed: Low Na  NUTRITION - FOCUSED PHYSICAL EXAM:  Working remotely.  Diet Order:   Diet Order            Diet NPO time specified  Diet effective now              EDUCATION NEEDS:   No education needs have been identified at this time  Skin:  Skin Assessment: Reviewed RN Assessment  Last BM:  not documented  Height:   Ht Readings from Last 1 Encounters:  04/17/19 5\' 10"  (1.778 m)    Weight:   Wt Readings from Last 1 Encounters:  04/17/19 66.7 kg     Ideal Body Weight:  75.5 kg  BMI:  Body mass index is 21.09 kg/m.  Estimated Nutritional Needs:   Kcal:  1800-2000  Protein:  90-100g  Fluid:  2L/day  Clayton Bibles, MS, RD, LDN Inpatient Clinical Dietitian Pager: [phone] After Hours Pager: [phone]

## 2019-04-19 NOTE — Progress Notes (Signed)
2 Days Post-Op   Subjective/Chief Complaint: No complaints.  Wound VAC is malfunctioning.   Objective: Vital signs in last 24 hours: Temp:  [97.5 F (36.4 C)-97.9 F (36.6 C)] 97.9 F (36.6 C) (01/17 0400) Pulse Rate:  [99-109] 99 (01/17 0700) Resp:  [20-28] 25 (01/17 0700) BP: (127-158)/(82-104) 145/83 (01/17 0600) SpO2:  [93 %-100 %] 97 % (01/17 0700)    Intake/Output from previous day: 01/16 0701 - 01/17 0700 In: 4294.8 [P.O.:840; I.V.:2903.5; IV Piggyback:551.3] Out: 1650 [Urine:1195; Emesis/NG output:300; Drains:155] Intake/Output this shift: No intake/output data recorded.  General appearance: alert and cooperative Resp: clear to auscultation bilaterally Cardio: regular rate and rhythm GI: Soft and appropriately tender.  Ostomy is pink with no output yet.  VAC removed and underlying wound clean and fascia intact.  Dressings applied.  Lab Results:  Recent Labs    04/18/19 0115 04/19/19 0140  WBC 14.7* 19.8*  HGB 13.5 10.4*  HCT 38.5* 29.4*  PLT 377 372   BMET Recent Labs    04/18/19 0115 04/19/19 0140  NA 128* 133*  K 4.5 4.1  CL 94* 98  CO2 22 22  GLUCOSE 141* 120*  BUN 19 31*  CREATININE 0.83 1.11  CALCIUM 7.6* 7.6*   PT/INR No results for input(s): LABPROT, INR in the last 72 hours. ABG No results for input(s): PHART, HCO3 in the last 72 hours.  Invalid input(s): PCO2, PO2  Studies/Results: CT ABDOMEN PELVIS W CONTRAST  Result Date: 04/17/2019 CLINICAL DATA:  Abdominal bloating and pain. Symptoms for 1 day. History of inguinal herniorrhaphy. History of diverticulitis, currently on antibiotics, started 2 days ago. EXAM: CT ABDOMEN AND PELVIS WITH CONTRAST TECHNIQUE: Multidetector CT imaging of the abdomen and pelvis was performed using the standard protocol following bolus administration of intravenous contrast. CONTRAST:  136mL ISOVUE-300 IOPAMIDOL (ISOVUE-300) INJECTION 61% COMPARISON:  01/29/2018. FINDINGS: Lower chest: Linear opacity at the  right lung base consistent with atelectasis. Heart normal in size. Hepatobiliary: 2 small right lobe low-density liver lesions consistent with cysts, stable. No other liver abnormality. Normal gallbladder. No bile duct dilation. Pancreas: Unremarkable. No pancreatic ductal dilatation or surrounding inflammatory changes. Spleen: Normal in size without focal abnormality. Adrenals/Urinary Tract: 1.7 cm left adrenal nodule, stable consistent with an adenoma. Normal right adrenal gland. Kidneys normal in size, orientation and position with symmetric enhancement and excretion. No masses, stones or hydronephrosis. Ureters are unremarkable. Bladder is unremarkable. Stomach/Bowel/peritoneal cavity: There is free intraperitoneal air as well as ascites. There is also right retroperitoneal air above the right kidney, under the posterior right hemidiaphragm. Air and dependent fluid collects adjacent to the liver with nondependent air along the anterior upper upper abdomen. In the pelvis, there are fluid collections, 1 on the right measuring 6 x 5.3 x 6 cm. A smaller on the left measuring 2.6 x 1.7 cm transversely. Additional fluid in the right posterior pelvis may reflect additional collection or be within pelvic small bowel. Apparent eccentric wall thickening along the mid sigmoid colon, poorly defined with hazy opacity in the adjacent fat. There are diverticula along this portion of the sigmoid colon. Mild distention of the cecum and lower ascending colon. Remainder of the colon is mostly decompressed. Appendix not defined on this exam. Mild dilation of loops of small bowel. Maximum dilation 4.3 cm. No convincing small bowel wall thickening or inflammation. Stomach is mostly decompressed but otherwise unremarkable. Vascular/Lymphatic: Minimal atherosclerosis at the aortic bifurcation. Atherosclerosis noted in the iliac arteries without stenosis. No discrete enlarged lymph nodes. Reproductive:  No acute or significant  abnormality. Other: No hernia. Musculoskeletal: No acute or significant osseous findings. IMPRESSION: 1. Perforated viscus. There is significant free intraperitoneal air as well as ascites, which creates air-fluid levels. There is a smaller amount of right-sided retroperitoneal air. The site of perforation is most likely a perforated sigmoid diverticulum in the setting of diverticulitis. Sigmoid colon shows eccentric wall thickening and adjacent inflammation. 2. There are also pelvic fluid collections concerning for abscesses. 3. My small-bowel dilation most likely due to adynamic ileus. 4. No other acute finding within the abdomen or pelvis. Electronically Signed   By: Lajean Manes M.D.   On: 04/17/2019 12:32    Anti-infectives: Anti-infectives (From admission, onward)   Start     Dose/Rate Route Frequency Ordered Stop   04/17/19 2200  piperacillin-tazobactam (ZOSYN) IVPB 3.375 g     3.375 g 12.5 mL/hr over 240 Minutes Intravenous Every 8 hours 04/17/19 1825     04/17/19 1400  cefoTEtan (CEFOTAN) 2 g in sodium chloride 0.9 % 100 mL IVPB     2 g 200 mL/hr over 30 Minutes Intravenous On call to O.R. 04/17/19 1349 04/17/19 1520      Assessment/Plan: s/p Procedure(s): EXPLORATORY LAPAROTOMY; RIGHT COLECTOMY WITH END ILEOSTOMY; LOW ANTERIOR RESECTION WITH END COLOSTOMY (N/A) Continue NG and bowel rest  Discontinue wound VAC and start wet-to-dry dressing changes. Continue Zosyn. White count elevated but only postop day 2.  Will hold on CT scan for now.  Continue to monitor in the ICU.  LOS: 2 days    Autumn Messing III 04/19/2019

## 2019-04-20 LAB — CBC
HCT: 27.7 % — ABNORMAL LOW (ref 39.0–52.0)
Hemoglobin: 9.5 g/dL — ABNORMAL LOW (ref 13.0–17.0)
MCH: 27.5 pg (ref 26.0–34.0)
MCHC: 34.3 g/dL (ref 30.0–36.0)
MCV: 80.3 fL (ref 80.0–100.0)
Platelets: 389 10*3/uL (ref 150–400)
RBC: 3.45 MIL/uL — ABNORMAL LOW (ref 4.22–5.81)
RDW: 14.1 % (ref 11.5–15.5)
WBC: 18.8 10*3/uL — ABNORMAL HIGH (ref 4.0–10.5)
nRBC: 0 % (ref 0.0–0.2)

## 2019-04-20 LAB — BASIC METABOLIC PANEL
Anion gap: 9 (ref 5–15)
BUN: 23 mg/dL — ABNORMAL HIGH (ref 6–20)
CO2: 26 mmol/L (ref 22–32)
Calcium: 7.8 mg/dL — ABNORMAL LOW (ref 8.9–10.3)
Chloride: 102 mmol/L (ref 98–111)
Creatinine, Ser: 0.7 mg/dL (ref 0.61–1.24)
GFR calc Af Amer: >60 mL/min (ref >60)
GFR calc non Af Amer: >60 mL/min (ref >60)
Glucose, Bld: 100 mg/dL — ABNORMAL HIGH (ref 70–99)
Potassium: 3.5 mmol/L (ref 3.5–5.1)
Sodium: 137 mmol/L (ref 135–145)

## 2019-04-20 LAB — SURGICAL PATHOLOGY

## 2019-04-20 MED ORDER — ACETAMINOPHEN 500 MG PO TABS
1000.0000 mg | ORAL_TABLET | Freq: Four times a day (QID) | ORAL | Status: DC
  Administered 2019-04-20 – 2019-04-29 (×30): 1000 mg via ORAL
  Filled 2019-04-20 (×36): qty 2

## 2019-04-20 MED ORDER — LISINOPRIL 10 MG PO TABS
10.0000 mg | ORAL_TABLET | Freq: Every day | ORAL | Status: DC
  Administered 2019-04-20 – 2019-04-29 (×10): 10 mg via ORAL
  Filled 2019-04-20 (×10): qty 1

## 2019-04-20 MED ORDER — KCL IN DEXTROSE-NACL 40-5-0.45 MEQ/L-%-% IV SOLN
INTRAVENOUS | Status: DC
  Filled 2019-04-20 (×6): qty 1000

## 2019-04-20 MED ORDER — POTASSIUM CHLORIDE 2 MEQ/ML IV SOLN: INTRAVENOUS | Status: DC

## 2019-04-20 NOTE — Consult Note (Addendum)
[place] Nurse ostomy consult note Surgical team following for assessment and plan of care for abd wound.  Pt previously had a Vac which has been discontinued over the weekend and he now has moist gauze dressings ordered BID.   Stoma type/location:  Pt had ileostomy and mucous fistula surgery performed on 1/15 Stomal assessment/size: Left abd with mucous fistula; 2 inches and round, red and viable and above skin level, small amt yellow drainage; intact skin surrounding.  Applied 2 piece pouch Right abd with ileostomy; 1 1/2 inches, red and viable, above skin level, intact skin surrounding. Mod amt liquid brown stool. Applied one piece pouch Education provided:  Pt states he is familiar with pouching routines since his father has an ostomy and he has assisted with application.  Discussed pouching routines and demonstrated pouch application.  He was able to open and close velcro to empty. Extra supplies left at the bedside.  [place] team will continue to follow for further teaching sessions when pt is stable and out of ICU. Enrolled patient in [place] program: Yes Julien Girt MSN, RN, [place], [place], [place]

## 2019-04-20 NOTE — Progress Notes (Signed)
Patient ID: Lemarr Poch, male   DOB: Aug 20, 1970, 49 y.o.   MRN: [id number]    3 Days Post-Op  Subjective: Patient with no nausea.  NGT suction heard in his throat and tube hanging out.  Walked some yesterday.  Pain seems relatively well-controlled.  ROS: See above, otherwise other systems negative  Objective: Vital signs in last 24 hours: Temp:  [97.6 F (36.4 C)-97.9 F (36.6 C)] 97.9 F (36.6 C) (01/18 0311) Pulse Rate:  [94-102] 95 (01/18 0400) Resp:  [18-27] 26 (01/18 0600) BP: (130-177)/(83-114) 153/93 (01/18 0600) SpO2:  [99 %-100 %] 100 % (01/18 0400)    Intake/Output from previous day: 01/17 0701 - 01/18 0700 In: 3876.2 [P.O.:510; I.V.:2911.9; IV Piggyback:454.4] Out: 1940 [Urine:1400; Emesis/NG output:450; Drains:55; Stool:35] Intake/Output this shift: No intake/output data recorded.  PE: Heart: regular, mildly tachy Lungs: CTAB Abd: soft, mild distention, few BS, NGT pretty much all the way out, ileostomy with some bilious/enteric output.  Mucous fistula with no current output.  Both stomas are viable.  Midline wound is clean.  Both JP drains with minimal serous output. GU: foley in place with clear yellow urine  Lab Results:  Recent Labs    04/19/19 0140 04/20/19 0209  WBC 19.8* 18.8*  HGB 10.4* 9.5*  HCT 29.4* 27.7*  PLT 372 389   BMET Recent Labs    04/19/19 0140 04/20/19 0209  NA 133* 137  K 4.1 3.5  CL 98 102  CO2 22 26  GLUCOSE 120* 100*  BUN 31* 23*  CREATININE 1.11 0.70  CALCIUM 7.6* 7.8*   PT/INR No results for input(s): LABPROT, INR in the last 72 hours. CMP     Component Value Date/Time   NA 137 04/20/2019 0209   K 3.5 04/20/2019 0209   CL 102 04/20/2019 0209   CO2 26 04/20/2019 0209   GLUCOSE 100 (H) 04/20/2019 0209   BUN 23 (H) 04/20/2019 0209   CREATININE 0.70 04/20/2019 0209   CALCIUM 7.8 (L) 04/20/2019 0209   PROT 5.7 (L) 04/17/2019 1905   ALBUMIN 2.7 (L) 04/17/2019 1905   AST 23 04/17/2019 1905   ALT 19 04/17/2019  1905   ALKPHOS 57 04/17/2019 1905   BILITOT 1.2 04/17/2019 1905   GFRNONAA >60 04/20/2019 0209   GFRAA >60 04/20/2019 0209   Lipase     Component Value Date/Time   LIPASE 16 04/11/2019 1241       Studies/Results: No results found.  Anti-infectives: Anti-infectives (From admission, onward)   Start     Dose/Rate Route Frequency Ordered Stop   04/17/19 2200  piperacillin-tazobactam (ZOSYN) IVPB 3.375 g     3.375 g 12.5 mL/hr over 240 Minutes Intravenous Every 8 hours 04/17/19 1825     04/17/19 1400  cefoTEtan (CEFOTAN) 2 g in sodium chloride 0.9 % 100 mL IVPB     2 g 200 mL/hr over 30 Minutes Intravenous On call to O.R. 04/17/19 1349 04/17/19 1520       Assessment/Plan ETOH abuse/use - CIWA  POD 3, s/p ex lap with cecectomy and end ileostomy, sigmoid colectomy with mucous fistula, Dr. Kieth Brightly -patient will need to have a cystoscopy prior to foley removal on POD 7. -cont zosyn, WBC down from 19 to 18 today -change IVFs to give some K as his is going down -DC NGT given some output from ileostomy and essentially his NGT is out anyway.  Keep NPO except few ice chips and sips of clears -mobilize TID -pulm toilet and IS -WOC consult for  new ostomy and mucous fistula -BID dressing change to midline wound -cont JP drains for now -DC tele and tx to floor today   FEN - NPO x ice and sips VTE - lovenox ID - zosyn   LOS: 3 days    Henreitta Cea , [facility] Surgery 04/20/2019, 7:56 AM Please see Amion for pager number during day hours 7:00am-4:30pm or 7:00am -11:30am on weekends

## 2019-04-21 LAB — CBC
HCT: 29.9 % — ABNORMAL LOW (ref 39.0–52.0)
Hemoglobin: 10.3 g/dL — ABNORMAL LOW (ref 13.0–17.0)
MCH: 27.9 pg (ref 26.0–34.0)
MCHC: 34.4 g/dL (ref 30.0–36.0)
MCV: 81 fL (ref 80.0–100.0)
Platelets: 411 10*3/uL — ABNORMAL HIGH (ref 150–400)
RBC: 3.69 MIL/uL — ABNORMAL LOW (ref 4.22–5.81)
RDW: 14.4 % (ref 11.5–15.5)
WBC: 14.1 10*3/uL — ABNORMAL HIGH (ref 4.0–10.5)
nRBC: 0 % (ref 0.0–0.2)

## 2019-04-21 LAB — BASIC METABOLIC PANEL
Anion gap: 7 (ref 5–15)
BUN: 16 mg/dL (ref 6–20)
CO2: 25 mmol/L (ref 22–32)
Calcium: 7.9 mg/dL — ABNORMAL LOW (ref 8.9–10.3)
Chloride: 100 mmol/L (ref 98–111)
Creatinine, Ser: 0.66 mg/dL (ref 0.61–1.24)
GFR calc Af Amer: >60 mL/min (ref >60)
GFR calc non Af Amer: >60 mL/min (ref >60)
Glucose, Bld: 105 mg/dL — ABNORMAL HIGH (ref 70–99)
Potassium: 3.9 mmol/L (ref 3.5–5.1)
Sodium: 132 mmol/L — ABNORMAL LOW (ref 135–145)

## 2019-04-21 MED ORDER — OXYBUTYNIN CHLORIDE 5 MG PO TABS
5.0000 mg | ORAL_TABLET | Freq: Three times a day (TID) | ORAL | Status: DC
  Administered 2019-04-21 – 2019-04-29 (×25): 5 mg via ORAL
  Filled 2019-04-21 (×26): qty 1

## 2019-04-21 MED ORDER — METOCLOPRAMIDE HCL 5 MG/ML IJ SOLN
10.0000 mg | Freq: Four times a day (QID) | INTRAMUSCULAR | Status: DC
  Administered 2019-04-21 – 2019-04-26 (×20): 10 mg via INTRAVENOUS
  Filled 2019-04-21 (×20): qty 2

## 2019-04-21 MED ORDER — ALUM & MAG HYDROXIDE-SIMETH 200-200-20 MG/5ML PO SUSP
15.0000 mL | ORAL | Status: DC | PRN
  Administered 2019-04-21 – 2019-04-29 (×12): 15 mL via ORAL
  Filled 2019-04-21 (×12): qty 30

## 2019-04-21 MED ORDER — HYDROMORPHONE HCL 1 MG/ML IJ SOLN
0.5000 mg | INTRAMUSCULAR | Status: DC | PRN
  Administered 2019-04-21 – 2019-04-28 (×37): 0.5 mg via INTRAVENOUS
  Filled 2019-04-21 (×37): qty 0.5

## 2019-04-21 NOTE — Progress Notes (Addendum)
Patient ID: Daniel Mcmillan, male   DOB: [birth year], 49 y.o.   MRN: [id number]    4 Days Post-Op  Subjective: Patient had difficulty getting staff and medication overnight. Complains of belching. Having bladder pressure and needs to stand and strain to empty bladder. Up walking.   ROS: See above, otherwise other systems negative  Objective: Vital signs in last 24 hours: Temp:  [98.1 F (36.7 C)-98.7 F (37.1 C)] 98.5 F (36.9 C) (01/19 0539) Pulse Rate:  [48-101] 93 (01/19 0539) Resp:  [14-32] 18 (01/19 0539) BP: (136-178)/(93-114) 136/98 (01/19 0539) SpO2:  [86 %-100 %] 100 % (01/19 0539)    Intake/Output from previous day: 01/18 0701 - 01/19 0700 In: 2208.9 [P.O.:180; I.V.:1729.9; IV Piggyback:299] Out: 2925 [Urine:2450; Drains:100; Stool:375] Intake/Output this shift: No intake/output data recorded.  PE: Heart: regular Lungs: CTAB Abd: soft, mild distention, ileostomy with stool and gas in bag.  Mucous fistula with minimal sweat.  Both stomas are viable.  Midline wound is clean.  Both JP drains with minimal serous output. GU: foley in place with clear yellow urine  Lab Results:  Recent Labs    04/20/19 0209 04/21/19 0319  WBC 18.8* 14.1*  HGB 9.5* 10.3*  HCT 27.7* 29.9*  PLT 389 411*   BMET Recent Labs    04/20/19 0209 04/21/19 0319  NA 137 132*  K 3.5 3.9  CL 102 100  CO2 26 25  GLUCOSE 100* 105*  BUN 23* 16  CREATININE 0.70 0.66  CALCIUM 7.8* 7.9*   PT/INR No results for input(s): LABPROT, INR in the last 72 hours. CMP     Component Value Date/Time   NA 132 (L) 04/21/2019 0319   K 3.9 04/21/2019 0319   CL 100 04/21/2019 0319   CO2 25 04/21/2019 0319   GLUCOSE 105 (H) 04/21/2019 0319   BUN 16 04/21/2019 0319   CREATININE 0.66 04/21/2019 0319   CALCIUM 7.9 (L) 04/21/2019 0319   PROT 5.7 (L) 04/17/2019 1905   ALBUMIN 2.7 (L) 04/17/2019 1905   AST 23 04/17/2019 1905   ALT 19 04/17/2019 1905   ALKPHOS 57 04/17/2019 1905   BILITOT 1.2 04/17/2019  1905   GFRNONAA >60 04/21/2019 0319   GFRAA >60 04/21/2019 0319   Lipase     Component Value Date/Time   LIPASE 16 04/11/2019 1241       Studies/Results: No results found.  Anti-infectives: Anti-infectives (From admission, onward)   Start     Dose/Rate Route Frequency Ordered Stop   04/17/19 2200  piperacillin-tazobactam (ZOSYN) IVPB 3.375 g     3.375 g 12.5 mL/hr over 240 Minutes Intravenous Every 8 hours 04/17/19 1825     04/17/19 1400  cefoTEtan (CEFOTAN) 2 g in sodium chloride 0.9 % 100 mL IVPB     2 g 200 mL/hr over 30 Minutes Intravenous On call to O.R. 04/17/19 1349 04/17/19 1520       Assessment/Plan ETOH abuse/use - CIWA  POD 4, s/p ex lap with cecectomy and end ileostomy, sigmoid colectomy with mucous fistula, Dr. Kieth Brightly -patient will need to have a cystogram prior to foley removal on POD 7. Add ditropan for bladder spasm sx -cont zosyn, WBC downtrending -Some belching, likely ileus despite stoma output.  Keep NPO except few ice chips and sips of clears. Add reglan -mobilize TID -pulm toilet and IS -WOC consult for new ostomy and mucous fistula -BID dressing change to midline wound -cont JP drains for now    FEN - NPO x ice and  sips VTE - lovenox ID - zosyn   LOS: 4 days    Clovis Riley , [place] [facility] 04/21/2019, 8:59 AM Please see Amion for pager number during day hours 7:00am-4:30pm or 7:00am -11:30am on weekends

## 2019-04-21 NOTE — Plan of Care (Signed)

## 2019-04-21 NOTE — Progress Notes (Signed)
Pt with day 4 foley.  Spoke with Dr. Kae Heller, noted to not remove foley until POD 7.  Will continue to follow.

## 2019-04-22 ENCOUNTER — Inpatient Hospital Stay (HOSPITAL_COMMUNITY): Payer: BC Managed Care – PPO

## 2019-04-22 LAB — BASIC METABOLIC PANEL
Anion gap: 6 (ref 5–15)
BUN: 14 mg/dL (ref 6–20)
CO2: 25 mmol/L (ref 22–32)
Calcium: 8 mg/dL — ABNORMAL LOW (ref 8.9–10.3)
Chloride: 102 mmol/L (ref 98–111)
Creatinine, Ser: 0.72 mg/dL (ref 0.61–1.24)
GFR calc Af Amer: >60 mL/min (ref >60)
GFR calc non Af Amer: >60 mL/min (ref >60)
Glucose, Bld: 109 mg/dL — ABNORMAL HIGH (ref 70–99)
Potassium: 4.7 mmol/L (ref 3.5–5.1)
Sodium: 133 mmol/L — ABNORMAL LOW (ref 135–145)

## 2019-04-22 LAB — CBC
HCT: 27.2 % — ABNORMAL LOW (ref 39.0–52.0)
Hemoglobin: 9.1 g/dL — ABNORMAL LOW (ref 13.0–17.0)
MCH: 27.7 pg (ref 26.0–34.0)
MCHC: 33.5 g/dL (ref 30.0–36.0)
MCV: 82.7 fL (ref 80.0–100.0)
Platelets: 411 10*3/uL — ABNORMAL HIGH (ref 150–400)
RBC: 3.29 MIL/uL — ABNORMAL LOW (ref 4.22–5.81)
RDW: 14.5 % (ref 11.5–15.5)
WBC: 14.8 10*3/uL — ABNORMAL HIGH (ref 4.0–10.5)
nRBC: 0 % (ref 0.0–0.2)

## 2019-04-22 LAB — MAGNESIUM: Magnesium: 2.5 mg/dL — ABNORMAL HIGH (ref 1.7–2.4)

## 2019-04-22 MED ORDER — POTASSIUM CHLORIDE 2 MEQ/ML IV SOLN: INTRAVENOUS | Status: DC

## 2019-04-22 MED ORDER — PANTOPRAZOLE SODIUM 40 MG IV SOLR
40.0000 mg | Freq: Two times a day (BID) | INTRAVENOUS | Status: DC
  Administered 2019-04-22 – 2019-04-23 (×4): 40 mg via INTRAVENOUS
  Filled 2019-04-22 (×4): qty 40

## 2019-04-22 MED ORDER — KCL IN DEXTROSE-NACL 20-5-0.45 MEQ/L-%-% IV SOLN
INTRAVENOUS | Status: DC
  Filled 2019-04-22 (×4): qty 1000

## 2019-04-22 NOTE — Progress Notes (Signed)
Patient ID: Daniel Mcmillan, male   DOB: May 13, 1970, 49 y.o.   MRN: [id number]    5 Days Post-Op  Subjective: Patient denies N/V, but has significant belching.  Passing some out of his ileostomy.  Still with distention.  ROS: See above, otherwise other systems negative  Objective: Vital signs in last 24 hours: Temp:  [97.6 F (36.4 C)-98.2 F (36.8 C)] 98.2 F (36.8 C) (01/20 0739) Pulse Rate:  [90-103] 92 (01/20 0739) Resp:  [16-18] 16 (01/20 0739) BP: (111-143)/(90-102) 138/97 (01/20 0739) SpO2:  [100 %] 100 % (01/20 0739) Last BM Date: 04/21/19  Intake/Output from previous day: 01/19 0701 - 01/20 0700 In: 2605.6 [P.O.:540; I.V.:1516.5; IV Piggyback:549.1] Out: 3710 [Urine:2900; Drains:60; Stool:750] Intake/Output this shift: Total I/O In: 30 [P.O.:30] Out: 390 [Urine:200; Stool:190]  PE: Heart: regular Lungs: CTAB Abd: distended, hypoactive BS, ileostomy pouch just changed with some dark enteric output.  Stoma is viable.  Mucous fistula with viable stoma, minimal output.  Midline wound is clean and packed.  Both JP drains with serous output GU: foley in place with clear yellow urine  Lab Results:  Recent Labs    04/21/19 0319 04/22/19 0329  WBC 14.1* 14.8*  HGB 10.3* 9.1*  HCT 29.9* 27.2*  PLT 411* 411*   BMET Recent Labs    04/21/19 0319 04/22/19 0329  NA 132* 133*  K 3.9 4.7  CL 100 102  CO2 25 25  GLUCOSE 105* 109*  BUN 16 14  CREATININE 0.66 0.72  CALCIUM 7.9* 8.0*   PT/INR No results for input(s): LABPROT, INR in the last 72 hours. CMP     Component Value Date/Time   NA 133 (L) 04/22/2019 0329   K 4.7 04/22/2019 0329   CL 102 04/22/2019 0329   CO2 25 04/22/2019 0329   GLUCOSE 109 (H) 04/22/2019 0329   BUN 14 04/22/2019 0329   CREATININE 0.72 04/22/2019 0329   CALCIUM 8.0 (L) 04/22/2019 0329   PROT 5.7 (L) 04/17/2019 1905   ALBUMIN 2.7 (L) 04/17/2019 1905   AST 23 04/17/2019 1905   ALT 19 04/17/2019 1905   ALKPHOS 57 04/17/2019 1905   BILITOT 1.2 04/17/2019 1905   GFRNONAA >60 04/22/2019 0329   GFRAA >60 04/22/2019 0329   Lipase     Component Value Date/Time   LIPASE 16 04/11/2019 1241       Studies/Results: No results found.  Anti-infectives: Anti-infectives (From admission, onward)   Start     Dose/Rate Route Frequency Ordered Stop   04/17/19 2200  piperacillin-tazobactam (ZOSYN) IVPB 3.375 g     3.375 g 12.5 mL/hr over 240 Minutes Intravenous Every 8 hours 04/17/19 1825     04/17/19 1400  cefoTEtan (CEFOTAN) 2 g in sodium chloride 0.9 % 100 mL IVPB     2 g 200 mL/hr over 30 Minutes Intravenous On call to O.R. 04/17/19 1349 04/17/19 1520       Assessment/Plan ETOH abuse/use - CIWA  POD 5, s/p ex lap with cecectomy and end ileostomy, sigmoid colectomy with mucous fistula, Dr. Kieth Brightly -patient will need to have a cystoscopy prior to foley removal on POD 7. -cont zosyn, WBC stable at 14K -patient with a lot of belching and distention.  He is having some ileostomy output.  Will check a plain film to get a baseline of his bowels.  Concerned that with his increasing "reflux", belching, etc that he still has an ileus. -mobilize TID -pulm toilet and IS -WOC consult for new ostomy and mucous fistula,  appreciate their assistance -BID dressing change to midline wound -cont JP drains for now   FEN - NPO x ice and sips, IVFs VTE - lovenox ID - zosyn   LOS: 5 days    Henreitta Cea , [facility] Surgery 04/22/2019, 9:29 AM Please see Amion for pager number during day hours 7:00am-4:30pm or 7:00am -11:30am on weekends

## 2019-04-22 NOTE — Consult Note (Addendum)
[place] Nurse ostomy follow-up consult note Surgical team following for assessment and plan of care for abd wound.    Stoma type/location:  Pt had ileostomy and mucous fistula surgery performed on 1/15 Stomal assessment/size: Left abd with mucous fistula; 2 inches and round, red and viable and above skin level, small amt yellow drainage; intact skin surrounding.  Applied one piece pouch Right abd with ileostomy; 1 1/2 inches, red and viable, above skin level, intact skin surrounding. Mod amt liquid brown stool. Applied one piece pouch Education provided:  Pt states he is familiar with pouching routines since his father has an ostomy and he assisted with application. Discussed pouching routines and demonstrated pouch application. He was able to open and close velcro to empty. Extra supplies left at the bedside, along with educational materials.  Reviewed dietary precautions and importance of avoiding dehydration. [place] team will continue to follow for further teaching sessions. Enrolled patient in [place] program: Yes Julien Girt MSN, RN, [place], [place], [place]

## 2019-04-23 ENCOUNTER — Encounter (HOSPITAL_COMMUNITY): Payer: Self-pay

## 2019-04-23 ENCOUNTER — Inpatient Hospital Stay (HOSPITAL_COMMUNITY): Payer: BC Managed Care – PPO

## 2019-04-23 LAB — CBC
HCT: 27 % — ABNORMAL LOW (ref 39.0–52.0)
Hemoglobin: 9 g/dL — ABNORMAL LOW (ref 13.0–17.0)
MCH: 27.7 pg (ref 26.0–34.0)
MCHC: 33.3 g/dL (ref 30.0–36.0)
MCV: 83.1 fL (ref 80.0–100.0)
Platelets: 468 10*3/uL — ABNORMAL HIGH (ref 150–400)
RBC: 3.25 MIL/uL — ABNORMAL LOW (ref 4.22–5.81)
RDW: 14.6 % (ref 11.5–15.5)
WBC: 12.9 10*3/uL — ABNORMAL HIGH (ref 4.0–10.5)
nRBC: 0 % (ref 0.0–0.2)

## 2019-04-23 LAB — BASIC METABOLIC PANEL
Anion gap: 6 (ref 5–15)
BUN: 11 mg/dL (ref 6–20)
CO2: 25 mmol/L (ref 22–32)
Calcium: 8 mg/dL — ABNORMAL LOW (ref 8.9–10.3)
Chloride: 102 mmol/L (ref 98–111)
Creatinine, Ser: 0.65 mg/dL (ref 0.61–1.24)
GFR calc Af Amer: >60 mL/min (ref >60)
GFR calc non Af Amer: >60 mL/min (ref >60)
Glucose, Bld: 105 mg/dL — ABNORMAL HIGH (ref 70–99)
Potassium: 4.3 mmol/L (ref 3.5–5.1)
Sodium: 133 mmol/L — ABNORMAL LOW (ref 135–145)

## 2019-04-23 LAB — MAGNESIUM: Magnesium: 2.2 mg/dL (ref 1.7–2.4)

## 2019-04-23 MED ORDER — DIPHENHYDRAMINE HCL 50 MG/ML IJ SOLN
12.5000 mg | Freq: Four times a day (QID) | INTRAMUSCULAR | Status: DC | PRN
  Administered 2019-04-24: 12.5 mg via INTRAVENOUS
  Filled 2019-04-23: qty 1

## 2019-04-23 MED ORDER — DIPHENHYDRAMINE HCL 12.5 MG/5ML PO ELIX: 12.5000 mg | ORAL_SOLUTION | Freq: Four times a day (QID) | ORAL | Status: DC | PRN

## 2019-04-23 MED ORDER — IOHEXOL 300 MG/ML  SOLN
100.0000 mL | Freq: Once | INTRAMUSCULAR | Status: AC | PRN
  Administered 2019-04-23: 100 mL via INTRAVENOUS

## 2019-04-23 MED ORDER — IOHEXOL 9 MG/ML PO SOLN
ORAL | Status: AC
  Filled 2019-04-23: qty 1000

## 2019-04-23 MED ORDER — ZOLPIDEM TARTRATE 5 MG PO TABS
5.0000 mg | ORAL_TABLET | Freq: Every evening | ORAL | Status: DC | PRN
  Administered 2019-04-24: 10 mg via ORAL
  Filled 2019-04-23: qty 2

## 2019-04-23 MED ORDER — IOHEXOL 9 MG/ML PO SOLN
500.0000 mL | ORAL | Status: AC
  Administered 2019-04-23: 08:00:00 500 mL via ORAL

## 2019-04-23 MED ORDER — SODIUM CHLORIDE (PF) 0.9 % IJ SOLN
INTRAMUSCULAR | Status: AC
  Filled 2019-04-23: qty 50

## 2019-04-23 NOTE — Consult Note (Signed)
Chief Complaint: Patient was seen in consultation today for perihepatic fluid collection/aspiration and drainage.  Referring Physician(s): Rayburn, Floyce Stakes ([facility])  Supervising Physician: Markus Daft  Patient Status: [facility] - In-pt  History of Present Illness: Arne Massengill is a 49 y.o. male with a past medical history of hypertension, diverticulitis, and alcohol abuse. He was directly admitted to [facility] 04/17/2019 by [facility] for management of recurrent diverticulitis with perforation. He underwent a right colectomy with end ileostomy and sigmoid colectomy with end colostomy in OR 04/17/2019 by Dr. Rich Number. Following procedure, patient with continued abdominal pain so CT abdomen/pelvis was ordered for further evaluation.  CT abdomen/pelvis today: 1. Status post end colostomy and right abdominal ileostomy. Proximal small bowel dilatation without focal transition point. Favor postoperative adynamic ileus. Low-grade partial small bowel obstruction cannot be excluded. 2. Perihepatic fluid and peritoneal enhancement, suspicious for loculated ascites and developing abscesses. Small collection within the upper right hemipelvis. 3. Small right pleural effusion with adjacent atelectasis or infection. 4.  Aortic Atherosclerosis (ICD10-I70.0). 5. Left adrenal adenoma. 6. Trace left pleural fluid.  IR requested by Brigid Re, PA-C for possible image-guided perihepatic fluid collection aspiration/drainage. Patient awake and alert sitting in chair with no complaints at this time. Denies fever, chills, chest pain, dyspnea, abdominal pain, or headache.   Past Medical History:  Diagnosis Date  . Diverticulosis   . Essential hypertension 04/19/2019  . ETOH abuse     Past Surgical History:  Procedure Laterality Date  . COLECTOMY WITH COLOSTOMY CREATION/HARTMANN PROCEDURE  04/17/2019   LAR with descending colon mucus fistula  . ILEOCECETOMY  04/17/2019   Cecal perforation from sigmoid colon stricture  .  ILEOSTOMY  04/17/2019  . INGUINAL HERNIA REPAIR    . IR RADIOLOGIST EVAL & MGMT  05/21/2017   Drainage diverticular abscess  . LAPAROTOMY N/A 04/17/2019   Procedure: EXPLORATORY LAPAROTOMY; RIGHT COLECTOMY WITH END ILEOSTOMY; LOW ANTERIOR RESECTION WITH END COLOSTOMY;  Surgeon: Kinsinger, Arta Bruce, MD;  Location: [facility];  Service: General;  Laterality: N/A;    Allergies: Patient has no known allergies.  Medications: Prior to Admission medications   Medication Sig Start Date End Date Taking? Authorizing Provider  acetaminophen (TYLENOL) 325 MG tablet You can take plain Tylenol 2 tablets every 4 hours as needed for pain.  You can alternate this with ibuprofen, or the Tylenol with codeine. DO NOT TAKE MORE THAN 4000 MG OF TYLENOL PER DAY.  IT CAN HARM YOUR LIVER.  TYLENOL (ACETAMINOPHEN) IS ALSO IN YOUR PRESCRIPTION PAIN MEDICATION.  YOU HAVE TO COUNT IT IN YOUR DAILY TOTAL. Patient taking differently: Take 650 mg by mouth every 4 (four) hours as needed.  05/17/17  Yes Earnstine Regal, PA-C  ciprofloxacin (CIPRO) 500 MG tablet Take 500 mg by mouth 2 (two) times daily. 04/15/19  Yes [provider]  ibuprofen (ADVIL,MOTRIN) 200 MG tablet You can take 2-3 tablets every 6 hours as needed for pain.  You can alternate this with plain Tylenol or the Tylenol 3.  You can buy this over-the-counter at any drugstore. Patient taking differently: Take 400-600 mg by mouth every 6 (six) hours as needed for moderate pain.  05/17/17  Yes Earnstine Regal, PA-C  metroNIDAZOLE (FLAGYL) 500 MG tablet Take 500 mg by mouth 3 (three) times daily. 04/15/19  Yes [provider]  saccharomyces boulardii (FLORASTOR) 250 MG capsule You can buy this over-the-counter at any drugstore.  Follow package directions.  Use this until you have completed your course of oral antibiotics. 05/17/17  Yes Earnstine Regal, PA-C  acetaminophen-codeine (TYLENOL #3) 300-30 MG tablet Take 1-2 tablets by mouth every 6 (six)  hours as needed (moderate pain not relieved by ibuprofen). Patient not taking: Reported on 04/17/2019 05/17/17   Earnstine Regal, PA-C  amoxicillin-clavulanate (AUGMENTIN) 875-125 MG tablet Take 1 tablet by mouth every 12 (twelve) hours. Patient not taking: Reported on 04/17/2019 05/17/17   Earnstine Regal, PA-C  lisinopril (PRINIVIL,ZESTRIL) 20 MG tablet Take 1 tablet daily.  Check your blood pressure at home at least 2 times per day and record.  Take this information to your primary care physician.  You should follow-up with primary care physician in 2-3 weeks.  We will not refill this prescription. Patient not taking: Reported on 04/17/2019 05/17/17   Earnstine Regal, PA-C  sodium chloride flush (NS) 0.9 % SOLN Flush your drain twice a day with these, as instructed by Radiology. 05/17/17   Earnstine Regal, PA-C     History reviewed. No pertinent family history.  Social History   Socioeconomic History  . Marital status: Single    Spouse name: Not on file  . Number of children: Not on file  . Years of education: Not on file  . Highest education level: Not on file  Occupational History  . Not on file  Tobacco Use  . Smoking status: Never Smoker  . Smokeless tobacco: Never Used  Substance and Sexual Activity  . Alcohol use: Yes    Alcohol/week: 42.0 standard drinks    Types: 42 Cans of beer per week    Comment: daily  . Drug use: Yes    Types: Marijuana  . Sexual activity: Yes  Other Topics Concern  . Not on file  Social History Narrative  . Not on file   Social Determinants of Health   Financial Resource Strain:   . Difficulty of Paying Living Expenses: Not on file  Food Insecurity:   . Worried About Charity fundraiser in the Last Year: Not on file  . Ran Out of Food in the Last Year: Not on file  Transportation Needs:   . Lack of Transportation (Medical): Not on file  . Lack of Transportation (Non-Medical): Not on file  Physical Activity:   . Days of Exercise per  Week: Not on file  . Minutes of Exercise per Session: Not on file  Stress:   . Feeling of Stress : Not on file  Social Connections:   . Frequency of Communication with Friends and Family: Not on file  . Frequency of Social Gatherings with Friends and Family: Not on file  . Attends Religious Services: Not on file  . Active Member of Clubs or Organizations: Not on file  . Attends Archivist Meetings: Not on file  . Marital Status: Not on file     Review of Systems: A 12 point ROS discussed and pertinent positives are indicated in the HPI above.  All other systems are negative.  Review of Systems  Constitutional: Negative for chills and fever.  Respiratory: Negative for shortness of breath and wheezing.   Cardiovascular: Negative for chest pain and palpitations.  Gastrointestinal: Negative for abdominal pain.  Neurological: Negative for headaches.  Psychiatric/Behavioral: Negative for behavioral problems and confusion.    Vital Signs: BP (!) 137/94 (BP Location: Right Arm)   Pulse 98   Temp 98.3 F (36.8 C) (Oral)   Resp 16   Ht 5\' 10"  (1.778 m)   Wt 147 lb (66.7 kg)   SpO2 100%  BMI 21.09 kg/m   Physical Exam Vitals and nursing note reviewed.  Constitutional:      General: He is not in acute distress.    Appearance: Normal appearance.  Cardiovascular:     Rate and Rhythm: Regular rhythm. Tachycardia present.     Heart sounds: Normal heart sounds. No murmur.  Pulmonary:     Effort: Pulmonary effort is normal. No respiratory distress.     Breath sounds: Normal breath sounds. No wheezing.  Skin:    General: Skin is warm and dry.  Neurological:     Mental Status: He is alert and oriented to person, place, and time.  Psychiatric:        Mood and Affect: Mood normal.        Behavior: Behavior normal.      MD Evaluation Airway: WNL Heart: WNL Abdomen: WNL Chest/ Lungs: WNL ASA  Classification: 2 Mallampati/Airway Score: One   Imaging: CT ABDOMEN  PELVIS W CONTRAST  Result Date: 04/23/2019 CLINICAL DATA:  Postop day 6, status post exploratory laparotomy with ileostomy. And colostomy with Hartmann's pouch. Abdominal pain/tenderness. EXAM: CT ABDOMEN AND PELVIS WITH CONTRAST TECHNIQUE: Multidetector CT imaging of the abdomen and pelvis was performed using the standard protocol following bolus administration of intravenous contrast. CONTRAST:  17mL OMNIPAQUE IOHEXOL 300 MG/ML  SOLN COMPARISON:  Plain films of 1 day prior.  Most recent CT 04/17/2019. FINDINGS: Lower chest: Right base collapse/consolidative change. Small right pleural effusion. Trace left effusion. Normal heart size. Hepatobiliary: Right hepatic lobe subcentimeter cyst. Normal gallbladder, without biliary ductal dilatation. Pancreas: Normal, without mass or ductal dilatation. Spleen: Normal in size, without focal abnormality. Adrenals/Urinary Tract: Similar left adrenal nodule, on the order of 1.6 cm. Present back to 2017, consistent with an adenoma. Normal right adrenal. No hydronephrosis. Urinary bladder decompressed around a Foley catheter. Stomach/Bowel: Normal stomach, without wall thickening. Hartman pouch. Left-sided colostomy and right-sided ileostomy. Small bowel loops are moderately dilated proximally. Example at 5.2 cm on 41/2. Distal small bowel loops are more normal in caliber. No focal transition point is seen. Vascular/Lymphatic: Aortic atherosclerosis. No abdominopelvic adenopathy. Reproductive: Normal prostate. Other: Anasarca. A right abdominal drain terminates adjacent the gallbladder. Small volume perihepatic fluid. Loculated components are identified about the right hepatic lobe with peritoneal thickening. Example at on the order of 2.7 x 5.8 cm on 31/2. A likely contiguous collection adjacent the caudate lobe, including at 6.2 x 4.1 cm on 22/2. A left pelvic drain terminates in the right abdominopelvic junction. Peripherally enhancing fluid collection in the upper right  pelvis measures 3.2 x 2.4 cm on 66/2. Midline laparotomy. Small volume free intraperitoneal and retroperitoneal air. Musculoskeletal: No acute osseous abnormality. IMPRESSION: 1. Status post end colostomy and right abdominal ileostomy. Proximal small bowel dilatation without focal transition point. Favor postoperative adynamic ileus. Low-grade partial small bowel obstruction cannot be excluded. 2. Perihepatic fluid and peritoneal enhancement, suspicious for loculated ascites and developing abscesses. Small collection within the upper right hemipelvis. 3. Small right pleural effusion with adjacent atelectasis or infection. 4.  Aortic Atherosclerosis (ICD10-I70.0). 5. Left adrenal adenoma. 6. Trace left pleural fluid. Electronically Signed   By: Abigail Miyamoto M.D.   On: 04/23/2019 11:19   CT ABDOMEN PELVIS W CONTRAST  Result Date: 04/17/2019 CLINICAL DATA:  Abdominal bloating and pain. Symptoms for 1 day. History of inguinal herniorrhaphy. History of diverticulitis, currently on antibiotics, started 2 days ago. EXAM: CT ABDOMEN AND PELVIS WITH CONTRAST TECHNIQUE: Multidetector CT imaging of the abdomen and pelvis was  performed using the standard protocol following bolus administration of intravenous contrast. CONTRAST:  166mL ISOVUE-300 IOPAMIDOL (ISOVUE-300) INJECTION 61% COMPARISON:  01/29/2018. FINDINGS: Lower chest: Linear opacity at the right lung base consistent with atelectasis. Heart normal in size. Hepatobiliary: 2 small right lobe low-density liver lesions consistent with cysts, stable. No other liver abnormality. Normal gallbladder. No bile duct dilation. Pancreas: Unremarkable. No pancreatic ductal dilatation or surrounding inflammatory changes. Spleen: Normal in size without focal abnormality. Adrenals/Urinary Tract: 1.7 cm left adrenal nodule, stable consistent with an adenoma. Normal right adrenal gland. Kidneys normal in size, orientation and position with symmetric enhancement and excretion. No  masses, stones or hydronephrosis. Ureters are unremarkable. Bladder is unremarkable. Stomach/Bowel/peritoneal cavity: There is free intraperitoneal air as well as ascites. There is also right retroperitoneal air above the right kidney, under the posterior right hemidiaphragm. Air and dependent fluid collects adjacent to the liver with nondependent air along the anterior upper upper abdomen. In the pelvis, there are fluid collections, 1 on the right measuring 6 x 5.3 x 6 cm. A smaller on the left measuring 2.6 x 1.7 cm transversely. Additional fluid in the right posterior pelvis may reflect additional collection or be within pelvic small bowel. Apparent eccentric wall thickening along the mid sigmoid colon, poorly defined with hazy opacity in the adjacent fat. There are diverticula along this portion of the sigmoid colon. Mild distention of the cecum and lower ascending colon. Remainder of the colon is mostly decompressed. Appendix not defined on this exam. Mild dilation of loops of small bowel. Maximum dilation 4.3 cm. No convincing small bowel wall thickening or inflammation. Stomach is mostly decompressed but otherwise unremarkable. Vascular/Lymphatic: Minimal atherosclerosis at the aortic bifurcation. Atherosclerosis noted in the iliac arteries without stenosis. No discrete enlarged lymph nodes. Reproductive: No acute or significant abnormality. Other: No hernia. Musculoskeletal: No acute or significant osseous findings. IMPRESSION: 1. Perforated viscus. There is significant free intraperitoneal air as well as ascites, which creates air-fluid levels. There is a smaller amount of right-sided retroperitoneal air. The site of perforation is most likely a perforated sigmoid diverticulum in the setting of diverticulitis. Sigmoid colon shows eccentric wall thickening and adjacent inflammation. 2. There are also pelvic fluid collections concerning for abscesses. 3. My small-bowel dilation most likely due to adynamic  ileus. 4. No other acute finding within the abdomen or pelvis. Electronically Signed   By: Lajean Manes M.D.   On: 04/17/2019 12:32   DG Abd Portable 1V  Result Date: 04/22/2019 CLINICAL DATA:  Abdominal distension and indigestion. Recent laparotomy with ileostomy, colectomy, colostomy and Hartman pouch 04/17/2019. EXAM: PORTABLE ABDOMEN - 1 VIEW COMPARISON:  11/28/2017 FINDINGS: Exam demonstrates a surgical drain over the right lateral mid abdomen as well as surgical drain with tip over the right upper pelvis. There are several air-filled mildly dilated small bowel loops measuring up to 4.8 cm in diameter. No definite free peritoneal air. Remaining bones and soft tissues are unremarkable. IMPRESSION: Several air-filled mildly dilated small bowel loops likely related to patient's recent surgery indicating postoperative ileus. Two surgical drains as described Electronically Signed   By: Marin Olp M.D.   On: 04/22/2019 12:36    Labs:  CBC: Recent Labs    04/20/19 0209 04/21/19 0319 04/22/19 0329 04/23/19 0439  WBC 18.8* 14.1* 14.8* 12.9*  HGB 9.5* 10.3* 9.1* 9.0*  HCT 27.7* 29.9* 27.2* 27.0*  PLT 389 411* 411* 468*    COAGS: No results for input(s): INR, APTT in the last 8760 hours.  BMP: Recent Labs    04/20/19 0209 04/21/19 0319 04/22/19 0329 04/23/19 0439  NA 137 132* 133* 133*  K 3.5 3.9 4.7 4.3  CL 102 100 102 102  CO2 26 25 25 25   GLUCOSE 100* 105* 109* 105*  BUN 23* 16 14 11   CALCIUM 7.8* 7.9* 8.0* 8.0*  CREATININE 0.70 0.66 0.72 0.65  GFRNONAA >60 >60 >60 >60  GFRAA >60 >60 >60 >60    LIVER FUNCTION TESTS: Recent Labs    04/11/19 1241 04/17/19 1350 04/17/19 1905  BILITOT 0.9 1.3* 1.2  AST 17 33 23  ALT 15 29 19   ALKPHOS 63 83 57  PROT 7.7 7.8 5.7*  ALBUMIN 3.8 3.2* 2.7*     Assessment and Plan:  Perihepatic fluid collection. Plan for image-guided perihepatic fluid collection aspiration with possible drain placement tentatively for tomorrow  04/24/2019 in IR. Patient will be NPO at midnight. Afebrile. Will hold Lovenox per IR protocol. INR pending for 0500 tomorrow.  Risks and benefits discussed with the patient including bleeding, infection, damage to adjacent structures, bowel perforation/fistula connection, and sepsis. All of the patient's questions were answered, patient is agreeable to proceed. Consent signed and in chart.   Thank you for this interesting consult.  I greatly enjoyed meeting Bayne Buehrer and look forward to participating in their care.  A copy of this report was sent to the requesting provider on this date.  Electronically Signed: Earley Abide, PA-C 04/23/2019, 4:07 PM   I spent a total of 40 Minutes in face to face in clinical consultation, greater than 50% of which was counseling/coordinating care for perihepatic fluid collection/aspiration and drainage.

## 2019-04-23 NOTE — Plan of Care (Signed)

## 2019-04-23 NOTE — Progress Notes (Signed)
[facility] Progress Note  6 Days Post-Op  Subjective: CC: distention Patient still quite distended but reports he feels like he is passing a little more gas. Still belching but feels like it is easier than yesterday. Waiting on pain medication this AM but reports pain as 4/10. Wants to be able to have a diet.   Objective: Vital signs in last 24 hours: Temp:  [97.7 F (36.5 C)-98.6 F (37 C)] 98.6 F (37 C) (01/21 0540) Pulse Rate:  [89-100] 89 (01/21 0540) Resp:  [17-21] 20 (01/21 0540) BP: (131-138)/(82-97) 131/93 (01/21 0540) SpO2:  [100 %] 100 % (01/21 0540) Last BM Date: 04/22/19  Intake/Output from previous day: 01/20 0701 - 01/21 0700 In: 2777.3 [P.O.:300; I.V.:2003.8; IV Piggyback:473.5] Out: 1382.5 [Urine:1000; Drains:12.5; Stool:370] Intake/Output this shift: No intake/output data recorded.  PE: Gen:  Alert, NAD, pleasant Card:  Regular rate and rhythm Pulm:  Normal effort, clear to auscultation bilaterally Abd: Soft, appropriately ttp, distended, BS hypoactive, midline wound clean, RLQ drain with minimal milky appearing fluid, LLQ drain with serous fluid, ileostomy viable with liquid stool output, mucus fistula viable with minimal  Skin: warm and dry, no rashes  Psych: A&Ox3   Lab Results:  Recent Labs    04/22/19 0329 04/23/19 0439  WBC 14.8* 12.9*  HGB 9.1* 9.0*  HCT 27.2* 27.0*  PLT 411* 468*   BMET Recent Labs    04/22/19 0329 04/23/19 0439  NA 133* 133*  K 4.7 4.3  CL 102 102  CO2 25 25  GLUCOSE 109* 105*  BUN 14 11  CREATININE 0.72 0.65  CALCIUM 8.0* 8.0*   PT/INR No results for input(s): LABPROT, INR in the last 72 hours. CMP     Component Value Date/Time   NA 133 (L) 04/23/2019 0439   K 4.3 04/23/2019 0439   CL 102 04/23/2019 0439   CO2 25 04/23/2019 0439   GLUCOSE 105 (H) 04/23/2019 0439   BUN 11 04/23/2019 0439   CREATININE 0.65 04/23/2019 0439   CALCIUM 8.0 (L) 04/23/2019 0439   PROT 5.7 (L) 04/17/2019 1905    ALBUMIN 2.7 (L) 04/17/2019 1905   AST 23 04/17/2019 1905   ALT 19 04/17/2019 1905   ALKPHOS 57 04/17/2019 1905   BILITOT 1.2 04/17/2019 1905   GFRNONAA >60 04/23/2019 0439   GFRAA >60 04/23/2019 0439   Lipase     Component Value Date/Time   LIPASE 16 04/11/2019 1241       Studies/Results: DG Abd Portable 1V  Result Date: 04/22/2019 CLINICAL DATA:  Abdominal distension and indigestion. Recent laparotomy with ileostomy, colectomy, colostomy and Hartman pouch 04/17/2019. EXAM: PORTABLE ABDOMEN - 1 VIEW COMPARISON:  11/28/2017 FINDINGS: Exam demonstrates a surgical drain over the right lateral mid abdomen as well as surgical drain with tip over the right upper pelvis. There are several air-filled mildly dilated small bowel loops measuring up to 4.8 cm in diameter. No definite free peritoneal air. Remaining bones and soft tissues are unremarkable. IMPRESSION: Several air-filled mildly dilated small bowel loops likely related to patient's recent surgery indicating postoperative ileus. Two surgical drains as described Electronically Signed   By: Marin Olp M.D.   On: 04/22/2019 12:36    Anti-infectives: Anti-infectives (From admission, onward)   Start     Dose/Rate Route Frequency Ordered Stop   04/17/19 2200  piperacillin-tazobactam (ZOSYN) IVPB 3.375 g     3.375 g 12.5 mL/hr over 240 Minutes Intravenous Every 8 hours 04/17/19 1825     04/17/19 1400  cefoTEtan (CEFOTAN) 2 g in sodium chloride 0.9 % 100 mL IVPB     2 g 200 mL/hr over 30 Minutes Intravenous On call to O.R. 04/17/19 1349 04/17/19 1520       Assessment/Plan ETOH abuse/use - CIWA  POD6 , s/p ex lap with cecectomy and end ileostomy, sigmoid colectomy with mucous fistula, Dr. Kieth Brightly -patient will need to have a cystoscopy prior to foley removal on POD 7. -cont zosyn, WBC down slightly this AM at 12 from 14 -patient with a lot of belching and distention.  He is having some ileostomy output.  Check CT today   -mobilize TID -pulm toilet and IS -WOC consult for new ostomy and mucous fistula, appreciate their assistance -BID dressing change to midline wound -cont JP drains for now  FEN -NPO x ice and sips, IVFs VTE -lovenox ID -[id number]  LOS: 6 days    Brigid Re , [facility] 04/23/2019, 8:20 AM Please see Amion for pager number during day hours 7:00am-4:30pm

## 2019-04-24 ENCOUNTER — Inpatient Hospital Stay (HOSPITAL_COMMUNITY): Payer: BC Managed Care – PPO

## 2019-04-24 LAB — CBC
HCT: 28.9 % — ABNORMAL LOW (ref 39.0–52.0)
Hemoglobin: 9.4 g/dL — ABNORMAL LOW (ref 13.0–17.0)
MCH: 27.2 pg (ref 26.0–34.0)
MCHC: 32.5 g/dL (ref 30.0–36.0)
MCV: 83.8 fL (ref 80.0–100.0)
Platelets: 558 10*3/uL — ABNORMAL HIGH (ref 150–400)
RBC: 3.45 MIL/uL — ABNORMAL LOW (ref 4.22–5.81)
RDW: 14.6 % (ref 11.5–15.5)
WBC: 19.3 10*3/uL — ABNORMAL HIGH (ref 4.0–10.5)
nRBC: 0 % (ref 0.0–0.2)

## 2019-04-24 LAB — PROTIME-INR
INR: 1.1 (ref 0.8–1.2)
Prothrombin Time: 14.5 seconds (ref 11.4–15.2)

## 2019-04-24 MED ORDER — DIPHENHYDRAMINE HCL 50 MG/ML IJ SOLN
25.0000 mg | Freq: Four times a day (QID) | INTRAMUSCULAR | Status: DC | PRN
  Administered 2019-04-27: 25 mg via INTRAVENOUS
  Filled 2019-04-24 (×2): qty 1

## 2019-04-24 MED ORDER — DIPHENHYDRAMINE HCL 12.5 MG/5ML PO ELIX
25.0000 mg | ORAL_SOLUTION | Freq: Four times a day (QID) | ORAL | Status: DC | PRN
  Administered 2019-04-24 – 2019-04-28 (×4): 25 mg via ORAL
  Filled 2019-04-24 (×4): qty 10

## 2019-04-24 MED ORDER — MIDAZOLAM HCL 2 MG/2ML IJ SOLN
INTRAMUSCULAR | Status: AC | PRN
  Administered 2019-04-24 (×2): 1 mg via INTRAVENOUS

## 2019-04-24 MED ORDER — FENTANYL CITRATE (PF) 100 MCG/2ML IJ SOLN
INTRAMUSCULAR | Status: AC
  Filled 2019-04-24: qty 2

## 2019-04-24 MED ORDER — METHOCARBAMOL 500 MG PO TABS
1000.0000 mg | ORAL_TABLET | Freq: Three times a day (TID) | ORAL | Status: DC
  Administered 2019-04-24 – 2019-04-25 (×6): 1000 mg via ORAL
  Filled 2019-04-24 (×6): qty 2

## 2019-04-24 MED ORDER — SODIUM CHLORIDE 0.9% FLUSH
5.0000 mL | Freq: Three times a day (TID) | INTRAVENOUS | Status: DC
  Administered 2019-04-24 – 2019-04-29 (×13): 5 mL

## 2019-04-24 MED ORDER — PANTOPRAZOLE SODIUM 40 MG PO TBEC
40.0000 mg | DELAYED_RELEASE_TABLET | Freq: Two times a day (BID) | ORAL | Status: DC
  Administered 2019-04-24 – 2019-04-29 (×11): 40 mg via ORAL
  Filled 2019-04-24 (×11): qty 1

## 2019-04-24 MED ORDER — MIDAZOLAM HCL 2 MG/2ML IJ SOLN
INTRAMUSCULAR | Status: AC
  Filled 2019-04-24: qty 2

## 2019-04-24 MED ORDER — FUROSEMIDE 10 MG/ML IJ SOLN
40.0000 mg | Freq: Two times a day (BID) | INTRAMUSCULAR | Status: AC
  Administered 2019-04-24 (×2): 40 mg via INTRAVENOUS
  Filled 2019-04-24 (×2): qty 4

## 2019-04-24 MED ORDER — LIDOCAINE HCL 1 % IJ SOLN
INTRAMUSCULAR | Status: AC
  Filled 2019-04-24: qty 20

## 2019-04-24 MED ORDER — BOOST / RESOURCE BREEZE PO LIQD CUSTOM
1.0000 | Freq: Three times a day (TID) | ORAL | Status: DC
  Administered 2019-04-24: 1 via ORAL

## 2019-04-24 MED ORDER — FENTANYL CITRATE (PF) 100 MCG/2ML IJ SOLN
INTRAMUSCULAR | Status: AC | PRN
  Administered 2019-04-24 (×2): 50 ug via INTRAVENOUS

## 2019-04-24 MED ORDER — IOTHALAMATE MEGLUMINE 17.2 % UR SOLN
250.0000 mL | Freq: Once | URETHRAL | Status: AC | PRN
  Administered 2019-04-24: 250 mL via INTRAVESICAL

## 2019-04-24 MED ORDER — LIDOCAINE HCL 1 % IJ SOLN
INTRAMUSCULAR | Status: AC | PRN
  Administered 2019-04-24: 10 mL via INTRADERMAL

## 2019-04-24 NOTE — Progress Notes (Signed)
Nutrition Follow-up  INTERVENTION:   Monitor magnesium, potassium, and phosphorus daily for at least 3 days, MD to replete as needed, as pt is at risk for refeeding syndrome.  -Boost Breeze po TID, each supplement provides 250 kcal and 9 grams of protein  NUTRITION DIAGNOSIS:   Increased nutrient needs related to post-op healing as evidenced by estimated needs.  Ongoing.  GOAL:   Patient will meet greater than or equal to 90% of their needs  Not meeting.  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 Recent CT scan shows large amount of free air, inflammation of the sigmoid colon with diverticulosis, multiple abscesses.   1/15: s/p ex lap, right colectomy with end ileostomy, sigmoid colectomy with end colostomy 1/18: NGT removed  Patient now on clear liquids. Is s/p CT guided drain placement in IR today.  Will order Boost Breeze to provide extra kcals and protein. May need to monitor for refeeding syndrome now that pt has been without substantial nutrition for 7 days.  Will monitor for further diet advancement. If pt cannot tolerate liquid diet, recommend nutrition support.  Admission weight: 147 lbs. No new weights have been recorded.  Medications: Folic acid tablet, IV Lasix, IV Reglan, Multivitamin with minerals daily, Thiamine tablet Labs reviewed: Low Na  Diet Order:   Diet Order            Diet clear liquid Room service appropriate? Yes; Fluid consistency: Thin  Diet effective now              EDUCATION NEEDS:   No education needs have been identified at this time  Skin:  Skin Assessment: Reviewed RN Assessment  Last BM:  1/22 (50 ml ileostomy, 25 ml colostomy)  Height:   Ht Readings from Last 1 Encounters:  04/17/19 5\' 10"  (1.778 m)    Weight:   Wt Readings from Last 1 Encounters:  04/17/19 66.7 kg    Ideal Body Weight:  75.5  kg  BMI:  Body mass index is 21.09 kg/m.  Estimated Nutritional Needs:   Kcal:  1800-2000  Protein:  90-100g  Fluid:  2L/day   Clayton Bibles, MS, RD, LDN Inpatient Clinical Dietitian Pager: [phone] After Hours Pager: [phone]

## 2019-04-24 NOTE — Progress Notes (Signed)
Patient throughout the night has been frequently standing up, stating it helps with his bladder pain. After getting up more frequently and having increased anxiety related to the bladder pain it warranted RN to further assess. Patient had output at the beginning of shift and once looking at foley output thus far only 50 cc was documented. Foley bag had no urine in it and tubing was empty. Patients penis is noted to have extreme pitting edema, +2 while examining the catheter. Patients penis was not swollen during assessment at the beginning of shift. Dr. Johney Maine was contacted in regards to the situation. I received an order to flush the catheter. After doing so, patients catheter emptied 600. The patient stated he felt some relief. Provided an ice pack for his swelling and will continue to monitor.

## 2019-04-24 NOTE — Progress Notes (Signed)
Patient ID: Daniel Mcmillan, male   DOB: [birth year], 49 y.o.   MRN: [id number]    7 Days Post-Op  Subjective: Foley got clogged overnight and has horrible pain with this.  Once relieved he was significantly better.  Otherwise no other complaints.  Tolerated his clear liquids ok yesterday.  No nausea.  Having a lot of swelling in his legs and feet  ROS: See above, otherwise other systems negative  Objective: Vital signs in last 24 hours: Temp:  [97.8 F (36.6 C)-98.5 F (36.9 C)] 97.8 F (36.6 C) (01/21 2128) Pulse Rate:  [98-113] 100 (01/21 2128) Resp:  [16-18] 16 (01/21 2128) BP: (134-144)/(77-104) 134/104 (01/21 2128) SpO2:  [100 %] 100 % (01/21 2128) Last BM Date: 04/23/19  Intake/Output from previous day: 01/21 0701 - 01/22 0700 In: 2630.1 [P.O.:460; I.V.:1526.8; IV Piggyback:633.4] Out: 958 [Urine:750; Drains:58; Stool:150] Intake/Output this shift: Total I/O In: -  Out: 58 [Stool:50]  PE: Heart: regular Lungs: CTAB Abd: soft, ileostomy with enteric output, stoma viable.  Midline wound is clean and packed.  Mucous fistula with viable stoma. Right-sided drain with purulent thick output.  Left-sided drain is just serous. GU: foley with clear yellow urine Ext: significant +2 pitting edema in legs and feet  Lab Results:  Recent Labs    04/23/19 0439 04/24/19 0357  WBC 12.9* 19.3*  HGB 9.0* 9.4*  HCT 27.0* 28.9*  PLT 468* 558*   BMET Recent Labs    04/22/19 0329 04/23/19 0439  NA 133* 133*  K 4.7 4.3  CL 102 102  CO2 25 25  GLUCOSE 109* 105*  BUN 14 11  CREATININE 0.72 0.65  CALCIUM 8.0* 8.0*   PT/INR Recent Labs    04/24/19 0357  LABPROT 14.5  INR 1.1   CMP     Component Value Date/Time   NA 133 (L) 04/23/2019 0439   K 4.3 04/23/2019 0439   CL 102 04/23/2019 0439   CO2 25 04/23/2019 0439   GLUCOSE 105 (H) 04/23/2019 0439   BUN 11 04/23/2019 0439   CREATININE 0.65 04/23/2019 0439   CALCIUM 8.0 (L) 04/23/2019 0439   PROT 5.7 (L) 04/17/2019 1905    ALBUMIN 2.7 (L) 04/17/2019 1905   AST 23 04/17/2019 1905   ALT 19 04/17/2019 1905   ALKPHOS 57 04/17/2019 1905   BILITOT 1.2 04/17/2019 1905   GFRNONAA >60 04/23/2019 0439   GFRAA >60 04/23/2019 0439   Lipase     Component Value Date/Time   LIPASE 16 04/11/2019 1241       Studies/Results: CT ABDOMEN PELVIS W CONTRAST  Result Date: 04/23/2019 CLINICAL DATA:  Postop day 6, status post exploratory laparotomy with ileostomy. And colostomy with Hartmann's pouch. Abdominal pain/tenderness. EXAM: CT ABDOMEN AND PELVIS WITH CONTRAST TECHNIQUE: Multidetector CT imaging of the abdomen and pelvis was performed using the standard protocol following bolus administration of intravenous contrast. CONTRAST:  133mL OMNIPAQUE IOHEXOL 300 MG/ML  SOLN COMPARISON:  Plain films of 1 day prior.  Most recent CT 04/17/2019. FINDINGS: Lower chest: Right base collapse/consolidative change. Small right pleural effusion. Trace left effusion. Normal heart size. Hepatobiliary: Right hepatic lobe subcentimeter cyst. Normal gallbladder, without biliary ductal dilatation. Pancreas: Normal, without mass or ductal dilatation. Spleen: Normal in size, without focal abnormality. Adrenals/Urinary Tract: Similar left adrenal nodule, on the order of 1.6 cm. Present back to 2017, consistent with an adenoma. Normal right adrenal. No hydronephrosis. Urinary bladder decompressed around a Foley catheter. Stomach/Bowel: Normal stomach, without wall thickening. Hartman pouch. Left-sided colostomy  and right-sided ileostomy. Small bowel loops are moderately dilated proximally. Example at 5.2 cm on 41/2. Distal small bowel loops are more normal in caliber. No focal transition point is seen. Vascular/Lymphatic: Aortic atherosclerosis. No abdominopelvic adenopathy. Reproductive: Normal prostate. Other: Anasarca. A right abdominal drain terminates adjacent the gallbladder. Small volume perihepatic fluid. Loculated components are identified about  the right hepatic lobe with peritoneal thickening. Example at on the order of 2.7 x 5.8 cm on 31/2. A likely contiguous collection adjacent the caudate lobe, including at 6.2 x 4.1 cm on 22/2. A left pelvic drain terminates in the right abdominopelvic junction. Peripherally enhancing fluid collection in the upper right pelvis measures 3.2 x 2.4 cm on 66/2. Midline laparotomy. Small volume free intraperitoneal and retroperitoneal air. Musculoskeletal: No acute osseous abnormality. IMPRESSION: 1. Status post end colostomy and right abdominal ileostomy. Proximal small bowel dilatation without focal transition point. Favor postoperative adynamic ileus. Low-grade partial small bowel obstruction cannot be excluded. 2. Perihepatic fluid and peritoneal enhancement, suspicious for loculated ascites and developing abscesses. Small collection within the upper right hemipelvis. 3. Small right pleural effusion with adjacent atelectasis or infection. 4.  Aortic Atherosclerosis (ICD10-I70.0). 5. Left adrenal adenoma. 6. Trace left pleural fluid. Electronically Signed   By: Abigail Miyamoto M.D.   On: 04/23/2019 11:19   DG Abd Portable 1V  Result Date: 04/22/2019 CLINICAL DATA:  Abdominal distension and indigestion. Recent laparotomy with ileostomy, colectomy, colostomy and Hartman pouch 04/17/2019. EXAM: PORTABLE ABDOMEN - 1 VIEW COMPARISON:  11/28/2017 FINDINGS: Exam demonstrates a surgical drain over the right lateral mid abdomen as well as surgical drain with tip over the right upper pelvis. There are several air-filled mildly dilated small bowel loops measuring up to 4.8 cm in diameter. No definite free peritoneal air. Remaining bones and soft tissues are unremarkable. IMPRESSION: Several air-filled mildly dilated small bowel loops likely related to patient's recent surgery indicating postoperative ileus. Two surgical drains as described Electronically Signed   By: Marin Olp M.D.   On: 04/22/2019 12:36     Anti-infectives: Anti-infectives (From admission, onward)   Start     Dose/Rate Route Frequency Ordered Stop   04/17/19 2200  piperacillin-tazobactam (ZOSYN) IVPB 3.375 g     3.375 g 12.5 mL/hr over 240 Minutes Intravenous Every 8 hours 04/17/19 1825     04/17/19 1400  cefoTEtan (CEFOTAN) 2 g in sodium chloride 0.9 % 100 mL IVPB     2 g 200 mL/hr over 30 Minutes Intravenous On call to O.R. 04/17/19 1349 04/17/19 1520       Assessment/Plan ETOH abuse/use  Fluid overload - SLIV, 40mg  of lasix BID for 2 doses to try and diurese him today.  Check BMET in am  POD7, s/p ex lap with cecectomy and end ileostomy, sigmoid colectomy with mucous fistula, Dr. Kieth Brightly -cystoscopy prior to foley removal today. -cont zosyn, WBCspiked today to 19K, likely secondary to RUQ fluid collection.  He is going to IR today for drainage of this collection.  This appears to possibly connect with the RUQ drain which is now purulent in nature. -cont clear liquids once out of her procedure. -mobilize TID -pulm toilet and IS -WOC following the patient for teaching -BID dressing change to midline wound -cont JP drains for now  FEN -SLIV, lasix x2 doses, CLD after IR procedure VTE -lovenox ID -zosyn   LOS: 7 days    Henreitta Cea , [facility] Surgery 04/24/2019, 9:21 AM Please see Amion for pager number during  day hours 7:00am-4:30pm or 7:00am -11:30am on weekends

## 2019-04-24 NOTE — Progress Notes (Signed)
Pt taken of unit for procedure

## 2019-04-24 NOTE — Procedures (Signed)
Pre procedural Dx: Perihepatic abscess Post procedural Dx: Same  Technically successful CT guided placed of a 10 Fr drainage catheter placement into the perihepatic space yielding 10 cc of complex blood tinged fluid.    A representative aspirated sample was capped and sent to the laboratory for analysis.    EBL: Trace Complications: None immediate  Ronny Bacon, MD Pager #: [phone]

## 2019-04-24 NOTE — Progress Notes (Signed)
Pt back on unit after drain placement. Doing well, vitals stable.  RN will monitor.

## 2019-04-24 NOTE — Progress Notes (Signed)
Pt requesting something for sleep. New orders received.

## 2019-04-24 NOTE — Plan of Care (Signed)

## 2019-04-24 NOTE — Consult Note (Signed)
[place] Nurse ostomy follow-up consultnote Surgical team following for assessment and plan of care for abd wound.   Stoma type/location:Pt had ileostomy and mucous fistula surgery performed on 1/15 Stomal assessment/size:Left abd with mucous fistula; 2 inches and round, red and viable and above skin level, small amt yellow drainage; intact skin surrounding. Applied one piece pouch Right abd with ileostomy; 1 1/2 inches, red and viable, above skin level, intact skin surrounding.Mod amt liquid brown stool.Applied one piece pouch Education provided:Pt is familiar with pouching routines since his father hasan ostomyand he assisted with application. He states he has been opening and closing velcro to empty independently in the bathroom when out of bed. Reviewed pouching routines and demonstrated pouch application and ordering supplies. Extra supplies left at the bedside, along with educational materials. Reviewed dietary precautions and importance of avoiding dehydration. [place] team will continue to follow for further teaching sessions. Enrolled patient in [place] program:Yes, previously [facility] MSN, [place], [place], [place], [place]

## 2019-04-25 LAB — CBC
HCT: 27.9 % — ABNORMAL LOW (ref 39.0–52.0)
Hemoglobin: 9.6 g/dL — ABNORMAL LOW (ref 13.0–17.0)
MCH: 28.2 pg (ref 26.0–34.0)
MCHC: 34.4 g/dL (ref 30.0–36.0)
MCV: 82.1 fL (ref 80.0–100.0)
Platelets: 694 10*3/uL — ABNORMAL HIGH (ref 150–400)
RBC: 3.4 MIL/uL — ABNORMAL LOW (ref 4.22–5.81)
RDW: 14.4 % (ref 11.5–15.5)
WBC: 15.7 10*3/uL — ABNORMAL HIGH (ref 4.0–10.5)
nRBC: 0 % (ref 0.0–0.2)

## 2019-04-25 LAB — BASIC METABOLIC PANEL
Anion gap: 11 (ref 5–15)
BUN: 16 mg/dL (ref 6–20)
CO2: 23 mmol/L (ref 22–32)
Calcium: 8.8 mg/dL — ABNORMAL LOW (ref 8.9–10.3)
Chloride: 99 mmol/L (ref 98–111)
Creatinine, Ser: 0.99 mg/dL (ref 0.61–1.24)
GFR calc Af Amer: >60 mL/min (ref >60)
GFR calc non Af Amer: >60 mL/min (ref >60)
Glucose, Bld: 89 mg/dL (ref 70–99)
Potassium: 4.3 mmol/L (ref 3.5–5.1)
Sodium: 133 mmol/L — ABNORMAL LOW (ref 135–145)

## 2019-04-25 NOTE — Progress Notes (Signed)
Supervising Physician: Markus Daft  Patient Status: [facility] - In-pt  Subjective: S/p perc drain to perihepatic collection yesterday Pt feels ok  Objective: Physical Exam: BP 135/82 (BP Location: Right Arm)   Pulse 93   Temp (!) 97.3 F (36.3 C) (Oral)   Resp (!) 21   Ht 5\' 10"  (1.778 m)   Wt 66.7 kg   SpO2 100%   BMI 21.09 kg/m  RUQ drain intact, site clean. Serous output   Current Facility-Administered Medications:  .  acetaminophen (TYLENOL) tablet 1,000 mg, 1,000 mg, Oral, Q6H, Saverio Danker, PA-C, 1,000 mg at 04/25/19 0744 .  alum & mag hydroxide-simeth (MAALOX/MYLANTA) 200-200-20 MG/5ML suspension 15 mL, 15 mL, Oral, Q4H PRN, Romana Juniper A, MD, 15 mL at 04/25/19 0830 .  Chlorhexidine Gluconate Cloth 2 % PADS 6 each, 6 each, Topical, Q0600, Michael Boston, MD, 6 each at 04/25/19 [phone] .  diphenhydrAMINE (BENADRYL) 12.5 MG/5ML elixir 25 mg, 25 mg, Oral, Q6H PRN, 25 mg at 04/24/19 2242 **OR** diphenhydrAMINE (BENADRYL) injection 25 mg, 25 mg, Intravenous, Q6H PRN, Saverio Danker, PA-C .  enoxaparin (LOVENOX) injection 40 mg, 40 mg, Subcutaneous, Q24H, Maczis, Barth Kirks, PA-C, 40 mg at 04/25/19 0744 .  feeding supplement (BOOST / RESOURCE BREEZE) liquid 1 Container, 1 Container, Oral, TID BM, Clovis Riley, MD, 1 Container at 04/24/19 2000 .  folic acid (FOLVITE) tablet 1 mg, 1 mg, Oral, Daily, Kinsinger, Arta Bruce, MD, 1 mg at 04/25/19 1000 .  HYDROmorphone (DILAUDID) injection 0.5 mg, 0.5 mg, Intravenous, Q2H PRN, Romana Juniper A, MD, 0.5 mg at 04/25/19 0959 .  lisinopril (ZESTRIL) tablet 10 mg, 10 mg, Oral, Daily, Saverio Danker, PA-C, 10 mg at 04/25/19 1000 .  MEDLINE mouth rinse, 15 mL, Mouth Rinse, BID, Michael Boston, MD, 15 mL at 04/24/19 2203 .  methocarbamol (ROBAXIN) tablet 1,000 mg, 1,000 mg, Oral, TID, Saverio Danker, PA-C, 1,000 mg at 04/25/19 0959 .  metoCLOPramide (REGLAN) injection 10 mg, 10 mg, Intravenous, Q6H, Romana Juniper A, MD, 10 mg at  04/25/19 0523 .  metoprolol tartrate (LOPRESSOR) injection 5 mg, 5 mg, Intravenous, Q6H PRN, Maczis, Barth Kirks, PA-C .  multivitamin with minerals tablet 1 tablet, 1 tablet, Oral, Daily, Kinsinger, Arta Bruce, MD, 1 tablet at 04/25/19 1000 .  ondansetron (ZOFRAN-ODT) disintegrating tablet 4 mg, 4 mg, Oral, Q6H PRN **OR** ondansetron (ZOFRAN) injection 4 mg, 4 mg, Intravenous, Q6H PRN, Maczis, Barth Kirks, PA-C .  oxybutynin (DITROPAN) tablet 5 mg, 5 mg, Oral, TID, Romana Juniper A, MD, 5 mg at 04/25/19 0959 .  oxyCODONE (Oxy IR/ROXICODONE) immediate release tablet 5-10 mg, 5-10 mg, Oral, Q4H PRN, Jillyn Ledger, PA-C, 10 mg at 04/25/19 0743 .  pantoprazole (PROTONIX) EC tablet 40 mg, 40 mg, Oral, BID, Saverio Danker, PA-C, 40 mg at 04/25/19 1001 .  phenol (CHLORASEPTIC) mouth spray 1 spray, 1 spray, Mouth/Throat, PRN, Autumn Messing III, MD .  piperacillin-tazobactam (ZOSYN) IVPB 3.375 g, 3.375 g, Intravenous, Q8H, Maczis, Barth Kirks, PA-C, Last Rate: 12.5 mL/hr at 04/25/19 0523, 3.375 g at 04/25/19 0523 .  sodium chloride flush (NS) 0.9 % injection 5 mL, 5 mL, Intracatheter, Q8H, Sandi Mariscal, MD, 5 mL at 04/25/19 0527 .  thiamine tablet 100 mg, 100 mg, Oral, Daily, 100 mg at 04/25/19 1000 **OR** thiamine (B-1) injection 100 mg, 100 mg, Intravenous, Daily, Kinsinger, Arta Bruce, MD, 100 mg at 04/19/19 1019  Labs: CBC Recent Labs    04/24/19 0357 04/25/19 0418  WBC 19.3* 15.7*  HGB  9.4* 9.6*  HCT 28.9* 27.9*  PLT 558* 694*   BMET Recent Labs    04/23/19 0439 04/25/19 0418  NA 133* 133*  K 4.3 4.3  CL 102 99  CO2 25 23  GLUCOSE 105* 89  BUN 11 16  CREATININE 0.65 0.99  CALCIUM 8.0* 8.8*   LFT No results for input(s): PROT, ALBUMIN, AST, ALT, ALKPHOS, BILITOT, BILIDIR, IBILI, LIPASE in the last 72 hours. PT/INR Recent Labs    04/24/19 0357  LABPROT 14.5  INR 1.1     Studies/Results: DG Cystogram  Result Date: 04/24/2019 CLINICAL DATA:  Diverticulitis, concern for bladder  leak EXAM: CYSTOGRAM TECHNIQUE: After catheterization of the urinary bladder following sterile technique the bladder was filled with 250 mL Cysto-Conray 17.2% by drip infusion. Serial spot images were obtained during bladder filling. FLUOROSCOPY TIME:  Fluoroscopy Time:  0.4 minutes Radiation Exposure Index (if provided by the fluoroscopic device): 6.4 mGy COMPARISON:  None. FINDINGS: There is no evidence of contrast extravasation. There are no diverticula. No intraluminal filling defects apart from Foley balloon. There was no reflux into the ureters. Normal emptying. IMPRESSION: No evidence of leak. Electronically Signed   By: Macy Mis M.D.   On: 04/24/2019 15:09   IR IMAGE GUIDED DRAINAGE BY PERCUTANEOUS CATHETER  Result Date: 04/24/2019 INDICATION: History of colonic perforation post right hemicolectomy, end ileostomy creation as well as low anterior resection with additional colostomy creation on 04/17/2019 (Dr. Kieth Brightly), with postoperative CT scan the abdomen pelvis performed 04/22/2018 demonstrating several ill-defined intra-abdominal fluid collections. Request made for image guided aspiration and or placement of a percutaneous drainage catheter about dominant perihepatic fluid collection. EXAM: IR IMAGE GUIDED DRAINAGE BY PERCUTANEOUS CATHETER COMPARISON:  CT abdomen and pelvis-04/23/2019 MEDICATIONS: The patient is currently admitted to the hospital and receiving intravenous antibiotics. The antibiotics were administered within an appropriate time frame prior to the initiation of the procedure. ANESTHESIA/SEDATION: Moderate (conscious) sedation was employed during this procedure. A total of Versed 2 mg and Fentanyl 100 mcg was administered intravenously. Moderate Sedation Time: 14 minutes. The patient's level of consciousness and vital signs were monitored continuously by radiology nursing throughout the procedure under my direct supervision. CONTRAST:  None COMPLICATIONS: None immediate.  FLUOROSCOPY TIME:  6 seconds (2 mGy) PROCEDURE: Informed written consent was obtained from the patient after a discussion of the risks, benefits and alternatives to treatment. The patient was placed supine on the fluoroscopy table. Sonographic evaluation performed of the right upper abdomen demonstrating a small though complex perihepatic fluid collection compatible the findings seen on preceding abdominal CT. As such, the skin overlying the right upper abdomen was prepped draped in the usual sterile fashion. A timeout was performed prior to the initiation of the procedure. A site along the right upper abdomen was marked fluoroscopically confirming access caudal to the right lung. Next, the overlying soft tissues were anesthetized with 1% lidocaine with epinephrine. Under direct ultrasound guidance, an 18 gauge trocar needle was utilized to access the complex perihepatic fluid collection and a short Amplatz wire was coiled within the collection. Multiple sonographic images saved procedural documentation purposes. Track was dilated allowing placement of a 10 French percutaneous drainage catheter. Appropriate position was confirmed with fluoroscopic imaging. Next, approximately 10 cc of complex bloody fluid was aspirated. The catheter was flushed with a small amount of saline and connected a JP bulb. Drain was secured with an interrupted suture and a Stat device. A dressing applied. The patient tolerated the procedure well without immediate post  procedural complication. IMPRESSION: Successful CT guided placement of a 10 French all purpose drain catheter into the perihepatic space with aspiration of 10 mL of complex, bloody fluid. Samples were sent to the laboratory as requested by the ordering clinical team. Electronically Signed   By: Sandi Mariscal M.D.   On: 04/24/2019 11:32    Assessment/Plan: S/p perihepatic fluid collection drain. Cont drain care, monitor output IR following    LOS: 8 days   I spent a  total of 15 minutes in face to face in clinical consultation, greater than 50% of which was counseling/coordinating care for drain  Ascencion Dike PA-C 04/25/2019 11:52 AM

## 2019-04-25 NOTE — Progress Notes (Signed)
Patient ID: Daniel Mcmillan, male   DOB: 12/19/1970, 49 y.o.   MRN: [id number]    8 Days Post-Op  Subjective: Got IR drain yesterday.  Tolerated his clear liquids ok.  No nausea.    Objective: Vital signs in last 24 hours: Temp:  [97.3 F (36.3 C)-98 F (36.7 C)] 97.3 F (36.3 C) (01/23 0609) Pulse Rate:  [86-107] 93 (01/23 0609) Resp:  [16-28] 21 (01/23 0609) BP: (118-140)/(77-99) 135/82 (01/23 0609) SpO2:  [96 %-100 %] 100 % (01/23 0609) Last BM Date: 04/23/19  Intake/Output from previous day: 01/22 0701 - 01/23 0700 In: 672.3 [P.O.:570; IV Piggyback:102.3] Out: 3703 [Urine:2825; Drains:133; Stool:745] Intake/Output this shift: Total I/O In: 360 [P.O.:360] Out: -   PE: Gen: NAD Abd: soft, ileostomy with enteric output, stoma viable.  Midline wound is clean and packed.  Mucous fistula with viable stoma. Right-sided drain with purulent thick output.  Left-sided drain is just serous.  IR drain in RUQ with SSF  Ext: significant +2 pitting edema in legs and feet  Lab Results:  Recent Labs    04/24/19 0357 04/25/19 0418  WBC 19.3* 15.7*  HGB 9.4* 9.6*  HCT 28.9* 27.9*  PLT 558* 694*   BMET Recent Labs    04/23/19 0439 04/25/19 0418  NA 133* 133*  K 4.3 4.3  CL 102 99  CO2 25 23  GLUCOSE 105* 89  BUN 11 16  CREATININE 0.65 0.99  CALCIUM 8.0* 8.8*   PT/INR Recent Labs    04/24/19 0357  LABPROT 14.5  INR 1.1   CMP     Component Value Date/Time   NA 133 (L) 04/25/2019 0418   K 4.3 04/25/2019 0418   CL 99 04/25/2019 0418   CO2 23 04/25/2019 0418   GLUCOSE 89 04/25/2019 0418   BUN 16 04/25/2019 0418   CREATININE 0.99 04/25/2019 0418   CALCIUM 8.8 (L) 04/25/2019 0418   PROT 5.7 (L) 04/17/2019 1905   ALBUMIN 2.7 (L) 04/17/2019 1905   AST 23 04/17/2019 1905   ALT 19 04/17/2019 1905   ALKPHOS 57 04/17/2019 1905   BILITOT 1.2 04/17/2019 1905   GFRNONAA >60 04/25/2019 0418   GFRAA >60 04/25/2019 0418   Lipase     Component Value Date/Time   LIPASE 16  04/11/2019 1241       Studies/Results: CT ABDOMEN PELVIS W CONTRAST  Result Date: 04/23/2019 CLINICAL DATA:  Postop day 6, status post exploratory laparotomy with ileostomy. And colostomy with Hartmann's pouch. Abdominal pain/tenderness. EXAM: CT ABDOMEN AND PELVIS WITH CONTRAST TECHNIQUE: Multidetector CT imaging of the abdomen and pelvis was performed using the standard protocol following bolus administration of intravenous contrast. CONTRAST:  159mL OMNIPAQUE IOHEXOL 300 MG/ML  SOLN COMPARISON:  Plain films of 1 day prior.  Most recent CT 04/17/2019. FINDINGS: Lower chest: Right base collapse/consolidative change. Small right pleural effusion. Trace left effusion. Normal heart size. Hepatobiliary: Right hepatic lobe subcentimeter cyst. Normal gallbladder, without biliary ductal dilatation. Pancreas: Normal, without mass or ductal dilatation. Spleen: Normal in size, without focal abnormality. Adrenals/Urinary Tract: Similar left adrenal nodule, on the order of 1.6 cm. Present back to 2017, consistent with an adenoma. Normal right adrenal. No hydronephrosis. Urinary bladder decompressed around a Foley catheter. Stomach/Bowel: Normal stomach, without wall thickening. Hartman pouch. Left-sided colostomy and right-sided ileostomy. Small bowel loops are moderately dilated proximally. Example at 5.2 cm on 41/2. Distal small bowel loops are more normal in caliber. No focal transition point is seen. Vascular/Lymphatic: Aortic atherosclerosis. No abdominopelvic adenopathy.  Reproductive: Normal prostate. Other: Anasarca. A right abdominal drain terminates adjacent the gallbladder. Small volume perihepatic fluid. Loculated components are identified about the right hepatic lobe with peritoneal thickening. Example at on the order of 2.7 x 5.8 cm on 31/2. A likely contiguous collection adjacent the caudate lobe, including at 6.2 x 4.1 cm on 22/2. A left pelvic drain terminates in the right abdominopelvic junction.  Peripherally enhancing fluid collection in the upper right pelvis measures 3.2 x 2.4 cm on 66/2. Midline laparotomy. Small volume free intraperitoneal and retroperitoneal air. Musculoskeletal: No acute osseous abnormality. IMPRESSION: 1. Status post end colostomy and right abdominal ileostomy. Proximal small bowel dilatation without focal transition point. Favor postoperative adynamic ileus. Low-grade partial small bowel obstruction cannot be excluded. 2. Perihepatic fluid and peritoneal enhancement, suspicious for loculated ascites and developing abscesses. Small collection within the upper right hemipelvis. 3. Small right pleural effusion with adjacent atelectasis or infection. 4.  Aortic Atherosclerosis (ICD10-I70.0). 5. Left adrenal adenoma. 6. Trace left pleural fluid. Electronically Signed   By: Abigail Miyamoto M.D.   On: 04/23/2019 11:19   DG Cystogram  Result Date: 04/24/2019 CLINICAL DATA:  Diverticulitis, concern for bladder leak EXAM: CYSTOGRAM TECHNIQUE: After catheterization of the urinary bladder following sterile technique the bladder was filled with 250 mL Cysto-Conray 17.2% by drip infusion. Serial spot images were obtained during bladder filling. FLUOROSCOPY TIME:  Fluoroscopy Time:  0.4 minutes Radiation Exposure Index (if provided by the fluoroscopic device): 6.4 mGy COMPARISON:  None. FINDINGS: There is no evidence of contrast extravasation. There are no diverticula. No intraluminal filling defects apart from Foley balloon. There was no reflux into the ureters. Normal emptying. IMPRESSION: No evidence of leak. Electronically Signed   By: Macy Mis M.D.   On: 04/24/2019 15:09   IR IMAGE GUIDED DRAINAGE BY PERCUTANEOUS CATHETER  Result Date: 04/24/2019 INDICATION: History of colonic perforation post right hemicolectomy, end ileostomy creation as well as low anterior resection with additional colostomy creation on 04/17/2019 (Dr. Kieth Brightly), with postoperative CT scan the abdomen pelvis  performed 04/22/2018 demonstrating several ill-defined intra-abdominal fluid collections. Request made for image guided aspiration and or placement of a percutaneous drainage catheter about dominant perihepatic fluid collection. EXAM: IR IMAGE GUIDED DRAINAGE BY PERCUTANEOUS CATHETER COMPARISON:  CT abdomen and pelvis-04/23/2019 MEDICATIONS: The patient is currently admitted to the hospital and receiving intravenous antibiotics. The antibiotics were administered within an appropriate time frame prior to the initiation of the procedure. ANESTHESIA/SEDATION: Moderate (conscious) sedation was employed during this procedure. A total of Versed 2 mg and Fentanyl 100 mcg was administered intravenously. Moderate Sedation Time: 14 minutes. The patient's level of consciousness and vital signs were monitored continuously by radiology nursing throughout the procedure under my direct supervision. CONTRAST:  None COMPLICATIONS: None immediate. FLUOROSCOPY TIME:  6 seconds (2 mGy) PROCEDURE: Informed written consent was obtained from the patient after a discussion of the risks, benefits and alternatives to treatment. The patient was placed supine on the fluoroscopy table. Sonographic evaluation performed of the right upper abdomen demonstrating a small though complex perihepatic fluid collection compatible the findings seen on preceding abdominal CT. As such, the skin overlying the right upper abdomen was prepped draped in the usual sterile fashion. A timeout was performed prior to the initiation of the procedure. A site along the right upper abdomen was marked fluoroscopically confirming access caudal to the right lung. Next, the overlying soft tissues were anesthetized with 1% lidocaine with epinephrine. Under direct ultrasound guidance, an 18 gauge trocar needle  was utilized to access the complex perihepatic fluid collection and a short Amplatz wire was coiled within the collection. Multiple sonographic images saved procedural  documentation purposes. Track was dilated allowing placement of a 10 French percutaneous drainage catheter. Appropriate position was confirmed with fluoroscopic imaging. Next, approximately 10 cc of complex bloody fluid was aspirated. The catheter was flushed with a small amount of saline and connected a JP bulb. Drain was secured with an interrupted suture and a Stat device. A dressing applied. The patient tolerated the procedure well without immediate post procedural complication. IMPRESSION: Successful CT guided placement of a 10 French all purpose drain catheter into the perihepatic space with aspiration of 10 mL of complex, bloody fluid. Samples were sent to the laboratory as requested by the ordering clinical team. Electronically Signed   By: Sandi Mariscal M.D.   On: 04/24/2019 11:32    Anti-infectives: Anti-infectives (From admission, onward)   Start     Dose/Rate Route Frequency Ordered Stop   04/17/19 2200  piperacillin-tazobactam (ZOSYN) IVPB 3.375 g     3.375 g 12.5 mL/hr over 240 Minutes Intravenous Every 8 hours 04/17/19 1825     04/17/19 1400  cefoTEtan (CEFOTAN) 2 g in sodium chloride 0.9 % 100 mL IVPB     2 g 200 mL/hr over 30 Minutes Intravenous On call to O.R. 04/17/19 1349 04/17/19 1520       Assessment/Plan ETOH abuse/use  Fluid overload - SLIV, 40mg  of lasix BID for 2 doses to try and diurese him today.  Check BMET in am  POD8, s/p ex lap with cecectomy and end ileostomy, sigmoid colectomy with mucous fistula, Dr. Kieth Brightly -cystoscopy prior to foley removal today. -cont zosyn, WBCspiked yesterday to 19K, likely secondary to RUQ fluid collection. He went for IR today for drainage of this collection and his wbc is down today.   -Full liquids today -mobilize TID -pulm toilet and IS -WOC following the patient for teaching -BID dressing change to midline wound -cont JP drains for now  FEN -SLIV, lasix x2 doses, CLD after IR procedure VTE -lovenox ID -zosyn    LOS: 8 days    Rosario Adie , [facility] 04/25/2019, 8:42 AM Please see Amion for pager number during day hours 7:00am-4:30pm or 7:00am -11:30am on weekends

## 2019-04-25 NOTE — Plan of Care (Signed)
  Problem: Clinical Measurements: Goal: Diagnostic test results will improve Outcome: Progressing   Problem: Clinical Measurements: Goal: Respiratory complications will improve Outcome: Progressing   Problem: Clinical Measurements: Goal: Cardiovascular complication will be avoided Outcome: Progressing   Problem: Coping: Goal: Level of anxiety will decrease Outcome: Progressing   Problem: Elimination: Goal: Will not experience complications related to bowel motility Outcome: Progressing   

## 2019-04-26 LAB — CBC
HCT: 28.3 % — ABNORMAL LOW (ref 39.0–52.0)
Hemoglobin: 9.2 g/dL — ABNORMAL LOW (ref 13.0–17.0)
MCH: 27.1 pg (ref 26.0–34.0)
MCHC: 32.5 g/dL (ref 30.0–36.0)
MCV: 83.2 fL (ref 80.0–100.0)
Platelets: 780 10*3/uL — ABNORMAL HIGH (ref 150–400)
RBC: 3.4 MIL/uL — ABNORMAL LOW (ref 4.22–5.81)
RDW: 14.5 % (ref 11.5–15.5)
WBC: 17.6 10*3/uL — ABNORMAL HIGH (ref 4.0–10.5)
nRBC: 0 % (ref 0.0–0.2)

## 2019-04-26 MED ORDER — CALCIUM POLYCARBOPHIL 625 MG PO TABS
625.0000 mg | ORAL_TABLET | Freq: Every day | ORAL | Status: DC
  Administered 2019-04-26 – 2019-04-29 (×4): 625 mg via ORAL
  Filled 2019-04-26 (×4): qty 1

## 2019-04-26 MED ORDER — METHOCARBAMOL 500 MG PO TABS
1000.0000 mg | ORAL_TABLET | Freq: Three times a day (TID) | ORAL | Status: DC | PRN
  Administered 2019-04-26 – 2019-04-28 (×5): 1000 mg via ORAL
  Filled 2019-04-26 (×5): qty 2

## 2019-04-26 NOTE — Progress Notes (Addendum)
Patient ID: Daniel Mcmillan, male   DOB: 09/23/1970, 49 y.o.   MRN: [id number]    9 Days Post-Op  Subjective: Tolerating liquids without nausea, still having some belching and distention, ileostomy is now high output.  Objective: Vital signs in last 24 hours: Temp:  [97.7 F (36.5 C)-97.9 F (36.6 C)] 97.8 F (36.6 C) (01/24 0434) Pulse Rate:  [91-94] 94 (01/24 0434) Resp:  [16-17] 16 (01/24 0434) BP: (106-134)/(53-82) 119/81 (01/24 0434) SpO2:  [100 %] 100 % (01/24 0434) Last BM Date: 04/23/19  Intake/Output from previous day: 01/23 0701 - 01/24 0700 In: 2126.7 [P.O.:1920; IV Piggyback:206.7] Out: 2785 [Urine:550; Drains:35; [id number] Intake/Output this shift: No intake/output data recorded.  PE: Gen: NAD Abd: soft, distended, ileostomy with watery output, stoma viable.  Midline wound is clean and granulating.  Mucous fistula with viable stoma. Right-sided drain with purulent thick output.  Left-sided drain is serosanguineous but there is some thicker fluid in the tubing.  IR drain in RUQ with SSF    Lab Results:  Recent Labs    04/25/19 0418 04/26/19 0417  WBC 15.7* 17.6*  HGB 9.6* 9.2*  HCT 27.9* 28.3*  PLT 694* 780*   BMET Recent Labs    04/25/19 0418  NA 133*  K 4.3  CL 99  CO2 23  GLUCOSE 89  BUN 16  CREATININE 0.99  CALCIUM 8.8*   PT/INR Recent Labs    04/24/19 0357  LABPROT 14.5  INR 1.1   CMP     Component Value Date/Time   NA 133 (L) 04/25/2019 0418   K 4.3 04/25/2019 0418   CL 99 04/25/2019 0418   CO2 23 04/25/2019 0418   GLUCOSE 89 04/25/2019 0418   BUN 16 04/25/2019 0418   CREATININE 0.99 04/25/2019 0418   CALCIUM 8.8 (L) 04/25/2019 0418   PROT 5.7 (L) 04/17/2019 1905   ALBUMIN 2.7 (L) 04/17/2019 1905   AST 23 04/17/2019 1905   ALT 19 04/17/2019 1905   ALKPHOS 57 04/17/2019 1905   BILITOT 1.2 04/17/2019 1905   GFRNONAA >60 04/25/2019 0418   GFRAA >60 04/25/2019 0418   Lipase     Component Value Date/Time   LIPASE 16  04/11/2019 1241       Studies/Results: DG Cystogram  Result Date: 04/24/2019 CLINICAL DATA:  Diverticulitis, concern for bladder leak EXAM: CYSTOGRAM TECHNIQUE: After catheterization of the urinary bladder following sterile technique the bladder was filled with 250 mL Cysto-Conray 17.2% by drip infusion. Serial spot images were obtained during bladder filling. FLUOROSCOPY TIME:  Fluoroscopy Time:  0.4 minutes Radiation Exposure Index (if provided by the fluoroscopic device): 6.4 mGy COMPARISON:  None. FINDINGS: There is no evidence of contrast extravasation. There are no diverticula. No intraluminal filling defects apart from Foley balloon. There was no reflux into the ureters. Normal emptying. IMPRESSION: No evidence of leak. Electronically Signed   By: Macy Mis M.D.   On: 04/24/2019 15:09   IR IMAGE GUIDED DRAINAGE BY PERCUTANEOUS CATHETER  Result Date: 04/24/2019 INDICATION: History of colonic perforation post right hemicolectomy, end ileostomy creation as well as low anterior resection with additional colostomy creation on 04/17/2019 (Dr. Kieth Brightly), with postoperative CT scan the abdomen pelvis performed 04/22/2018 demonstrating several ill-defined intra-abdominal fluid collections. Request made for image guided aspiration and or placement of a percutaneous drainage catheter about dominant perihepatic fluid collection. EXAM: IR IMAGE GUIDED DRAINAGE BY PERCUTANEOUS CATHETER COMPARISON:  CT abdomen and pelvis-04/23/2019 MEDICATIONS: The patient is currently admitted to the hospital and receiving intravenous  antibiotics. The antibiotics were administered within an appropriate time frame prior to the initiation of the procedure. ANESTHESIA/SEDATION: Moderate (conscious) sedation was employed during this procedure. A total of Versed 2 mg and Fentanyl 100 mcg was administered intravenously. Moderate Sedation Time: 14 minutes. The patient's level of consciousness and vital signs were monitored  continuously by radiology nursing throughout the procedure under my direct supervision. CONTRAST:  None COMPLICATIONS: None immediate. FLUOROSCOPY TIME:  6 seconds (2 mGy) PROCEDURE: Informed written consent was obtained from the patient after a discussion of the risks, benefits and alternatives to treatment. The patient was placed supine on the fluoroscopy table. Sonographic evaluation performed of the right upper abdomen demonstrating a small though complex perihepatic fluid collection compatible the findings seen on preceding abdominal CT. As such, the skin overlying the right upper abdomen was prepped draped in the usual sterile fashion. A timeout was performed prior to the initiation of the procedure. A site along the right upper abdomen was marked fluoroscopically confirming access caudal to the right lung. Next, the overlying soft tissues were anesthetized with 1% lidocaine with epinephrine. Under direct ultrasound guidance, an 18 gauge trocar needle was utilized to access the complex perihepatic fluid collection and a short Amplatz wire was coiled within the collection. Multiple sonographic images saved procedural documentation purposes. Track was dilated allowing placement of a 10 French percutaneous drainage catheter. Appropriate position was confirmed with fluoroscopic imaging. Next, approximately 10 cc of complex bloody fluid was aspirated. The catheter was flushed with a small amount of saline and connected a JP bulb. Drain was secured with an interrupted suture and a Stat device. A dressing applied. The patient tolerated the procedure well without immediate post procedural complication. IMPRESSION: Successful CT guided placement of a 10 French all purpose drain catheter into the perihepatic space with aspiration of 10 mL of complex, bloody fluid. Samples were sent to the laboratory as requested by the ordering clinical team. Electronically Signed   By: Sandi Mariscal M.D.   On: 04/24/2019 11:32     Anti-infectives: Anti-infectives (From admission, onward)   Start     Dose/Rate Route Frequency Ordered Stop   04/17/19 2200  piperacillin-tazobactam (ZOSYN) IVPB 3.375 g     3.375 g 12.5 mL/hr over 240 Minutes Intravenous Every 8 hours 04/17/19 1825     04/17/19 1400  cefoTEtan (CEFOTAN) 2 g in sodium chloride 0.9 % 100 mL IVPB     2 g 200 mL/hr over 30 Minutes Intravenous On call to O.R. 04/17/19 1349 04/17/19 1520       Assessment/Plan ETOH abuse/use  Fluid overload - SLIV, received Lasix on Friday in between that and his ostomy output is now about 700 negative for yesterday  POD9, s/p ex lap with cecectomy and end ileostomy, sigmoid colectomy with mucous fistula, Dr. Kieth Brightly -Cystogram negative, Foley removed and he is voiding without difficulty. -cont zosyn, WBC remains elevated.  Multiple drains in place.   -We will try soft diet today -mobilize TID-he is doing this in more on his own -pulm toilet and IS -WOC following the patient for teaching -BID dressing change to midline wound -cont JP drains for now  FEN -monitor closely, ostomy output has jumped and his creatinine is up very slightly.  Hesitant to initiate any antimotility medications given ongoing ileus.  We will try soft diet and hopefully this will begin the ostomy output up somewhat.  No more Lasix.  We will hold off on any fluid at this point and try to  let him naturally recruit his third space fluid.  Stop Reglan. VTE -lovenox ID -zosyn   LOS: 9 days    Clovis Riley , [facility] 04/26/2019, 8:23 AM Please see Amion for pager number during day hours 7:00am-4:30pm or 7:00am -11:30am on weekends

## 2019-04-27 LAB — CBC
HCT: 25.8 % — ABNORMAL LOW (ref 39.0–52.0)
Hemoglobin: 8.6 g/dL — ABNORMAL LOW (ref 13.0–17.0)
MCH: 27.5 pg (ref 26.0–34.0)
MCHC: 33.3 g/dL (ref 30.0–36.0)
MCV: 82.4 fL (ref 80.0–100.0)
Platelets: 745 10*3/uL — ABNORMAL HIGH (ref 150–400)
RBC: 3.13 MIL/uL — ABNORMAL LOW (ref 4.22–5.81)
RDW: 14.2 % (ref 11.5–15.5)
WBC: 15 10*3/uL — ABNORMAL HIGH (ref 4.0–10.5)
nRBC: 0 % (ref 0.0–0.2)

## 2019-04-27 LAB — BASIC METABOLIC PANEL
Anion gap: 10 (ref 5–15)
BUN: 16 mg/dL (ref 6–20)
CO2: 26 mmol/L (ref 22–32)
Calcium: 8.4 mg/dL — ABNORMAL LOW (ref 8.9–10.3)
Chloride: 92 mmol/L — ABNORMAL LOW (ref 98–111)
Creatinine, Ser: 0.87 mg/dL (ref 0.61–1.24)
GFR calc Af Amer: >60 mL/min (ref >60)
GFR calc non Af Amer: >60 mL/min (ref >60)
Glucose, Bld: 94 mg/dL (ref 70–99)
Potassium: 4.2 mmol/L (ref 3.5–5.1)
Sodium: 128 mmol/L — ABNORMAL LOW (ref 135–145)

## 2019-04-27 LAB — MAGNESIUM: Magnesium: 2.2 mg/dL (ref 1.7–2.4)

## 2019-04-27 MED ORDER — SODIUM CHLORIDE 1 G PO TABS
1.0000 g | ORAL_TABLET | Freq: Every day | ORAL | Status: AC
  Administered 2019-04-27 – 2019-04-29 (×3): 1 g via ORAL
  Filled 2019-04-27 (×3): qty 1

## 2019-04-27 MED ORDER — LOPERAMIDE HCL 2 MG PO CAPS
2.0000 mg | ORAL_CAPSULE | Freq: Three times a day (TID) | ORAL | Status: DC | PRN
  Filled 2019-04-27: qty 1

## 2019-04-27 MED ORDER — LOPERAMIDE HCL 2 MG PO CAPS
2.0000 mg | ORAL_CAPSULE | Freq: Every day | ORAL | Status: DC
  Administered 2019-04-27: 2 mg via ORAL

## 2019-04-27 MED ORDER — SALINE SPRAY 0.65 % NA SOLN
1.0000 | NASAL | Status: DC | PRN
  Filled 2019-04-27: qty 44

## 2019-04-27 NOTE — Progress Notes (Signed)
Referring Physician(s): Connor,C  Supervising Physician: Corrie Mckusick  Patient Status:  [facility] - In-pt  Chief Complaint:  Abdominal pain/fluid collections  Subjective: Pt feeling ok today; has had some indigestion; no N/V   Allergies: Patient has no known allergies.  Medications: Prior to Admission medications   Medication Sig Start Date End Date Taking? Authorizing Provider  acetaminophen (TYLENOL) 325 MG tablet You can take plain Tylenol 2 tablets every 4 hours as needed for pain.  You can alternate this with ibuprofen, or the Tylenol with codeine. DO NOT TAKE MORE THAN 4000 MG OF TYLENOL PER DAY.  IT CAN HARM YOUR LIVER.  TYLENOL (ACETAMINOPHEN) IS ALSO IN YOUR PRESCRIPTION PAIN MEDICATION.  YOU HAVE TO COUNT IT IN YOUR DAILY TOTAL. Patient taking differently: Take 650 mg by mouth every 4 (four) hours as needed.  05/17/17  Yes Earnstine Regal, PA-C  ciprofloxacin (CIPRO) 500 MG tablet Take 500 mg by mouth 2 (two) times daily. 04/15/19  Yes [provider]  ibuprofen (ADVIL,MOTRIN) 200 MG tablet You can take 2-3 tablets every 6 hours as needed for pain.  You can alternate this with plain Tylenol or the Tylenol 3.  You can buy this over-the-counter at any drugstore. Patient taking differently: Take 400-600 mg by mouth every 6 (six) hours as needed for moderate pain.  05/17/17  Yes Earnstine Regal, PA-C  metroNIDAZOLE (FLAGYL) 500 MG tablet Take 500 mg by mouth 3 (three) times daily. 04/15/19  Yes [provider]  saccharomyces boulardii (FLORASTOR) 250 MG capsule You can buy this over-the-counter at any drugstore.  Follow package directions.  Use this until you have completed your course of oral antibiotics. 05/17/17  Yes Earnstine Regal, PA-C  acetaminophen-codeine (TYLENOL #3) 300-30 MG tablet Take 1-2 tablets by mouth every 6 (six) hours as needed (moderate pain not relieved by ibuprofen). Patient not taking: Reported on 04/17/2019 05/17/17   Earnstine Regal,  PA-C  amoxicillin-clavulanate (AUGMENTIN) 875-125 MG tablet Take 1 tablet by mouth every 12 (twelve) hours. Patient not taking: Reported on 04/17/2019 05/17/17   Earnstine Regal, PA-C  lisinopril (PRINIVIL,ZESTRIL) 20 MG tablet Take 1 tablet daily.  Check your blood pressure at home at least 2 times per day and record.  Take this information to your primary care physician.  You should follow-up with primary care physician in 2-3 weeks.  We will not refill this prescription. Patient not taking: Reported on 04/17/2019 05/17/17   Earnstine Regal, PA-C  sodium chloride flush (NS) 0.9 % SOLN Flush your drain twice a day with these, as instructed by Radiology. 05/17/17   Earnstine Regal, PA-C     Vital Signs: BP 136/82   Pulse 97   Temp 97.8 F (36.6 C) (Oral)   Resp 14   Ht 5\' 10"  (1.778 m)   Wt 147 lb (66.7 kg)   SpO2 100%   BMI 21.09 kg/m   Physical Exam awake/alert; RUQ drain intact, insertion site ok, mildly tender, OP 45 cc yesterday about 10-15 cc today turbid light red fluid  Imaging: DG Cystogram  Result Date: 04/24/2019 CLINICAL DATA:  Diverticulitis, concern for bladder leak EXAM: CYSTOGRAM TECHNIQUE: After catheterization of the urinary bladder following sterile technique the bladder was filled with 250 mL Cysto-Conray 17.2% by drip infusion. Serial spot images were obtained during bladder filling. FLUOROSCOPY TIME:  Fluoroscopy Time:  0.4 minutes Radiation Exposure Index (if provided by the fluoroscopic device): 6.4 mGy COMPARISON:  None. FINDINGS: There is no evidence of contrast extravasation. There are no  diverticula. No intraluminal filling defects apart from Foley balloon. There was no reflux into the ureters. Normal emptying. IMPRESSION: No evidence of leak. Electronically Signed   By: Macy Mis M.D.   On: 04/24/2019 15:09   IR IMAGE GUIDED DRAINAGE BY PERCUTANEOUS CATHETER  Result Date: 04/24/2019 INDICATION: History of colonic perforation post right hemicolectomy,  end ileostomy creation as well as low anterior resection with additional colostomy creation on 04/17/2019 (Dr. Kieth Brightly), with postoperative CT scan the abdomen pelvis performed 04/22/2018 demonstrating several ill-defined intra-abdominal fluid collections. Request made for image guided aspiration and or placement of a percutaneous drainage catheter about dominant perihepatic fluid collection. EXAM: IR IMAGE GUIDED DRAINAGE BY PERCUTANEOUS CATHETER COMPARISON:  CT abdomen and pelvis-04/23/2019 MEDICATIONS: The patient is currently admitted to the hospital and receiving intravenous antibiotics. The antibiotics were administered within an appropriate time frame prior to the initiation of the procedure. ANESTHESIA/SEDATION: Moderate (conscious) sedation was employed during this procedure. A total of Versed 2 mg and Fentanyl 100 mcg was administered intravenously. Moderate Sedation Time: 14 minutes. The patient's level of consciousness and vital signs were monitored continuously by radiology nursing throughout the procedure under my direct supervision. CONTRAST:  None COMPLICATIONS: None immediate. FLUOROSCOPY TIME:  6 seconds (2 mGy) PROCEDURE: Informed written consent was obtained from the patient after a discussion of the risks, benefits and alternatives to treatment. The patient was placed supine on the fluoroscopy table. Sonographic evaluation performed of the right upper abdomen demonstrating a small though complex perihepatic fluid collection compatible the findings seen on preceding abdominal CT. As such, the skin overlying the right upper abdomen was prepped draped in the usual sterile fashion. A timeout was performed prior to the initiation of the procedure. A site along the right upper abdomen was marked fluoroscopically confirming access caudal to the right lung. Next, the overlying soft tissues were anesthetized with 1% lidocaine with epinephrine. Under direct ultrasound guidance, an 18 gauge trocar needle  was utilized to access the complex perihepatic fluid collection and a short Amplatz wire was coiled within the collection. Multiple sonographic images saved procedural documentation purposes. Track was dilated allowing placement of a 10 French percutaneous drainage catheter. Appropriate position was confirmed with fluoroscopic imaging. Next, approximately 10 cc of complex bloody fluid was aspirated. The catheter was flushed with a small amount of saline and connected a JP bulb. Drain was secured with an interrupted suture and a Stat device. A dressing applied. The patient tolerated the procedure well without immediate post procedural complication. IMPRESSION: Successful CT guided placement of a 10 French all purpose drain catheter into the perihepatic space with aspiration of 10 mL of complex, bloody fluid. Samples were sent to the laboratory as requested by the ordering clinical team. Electronically Signed   By: Sandi Mariscal M.D.   On: 04/24/2019 11:32    Labs:  CBC: Recent Labs    04/24/19 0357 04/25/19 0418 04/26/19 0417 04/27/19 0535  WBC 19.3* 15.7* 17.6* 15.0*  HGB 9.4* 9.6* 9.2* 8.6*  HCT 28.9* 27.9* 28.3* 25.8*  PLT 558* 694* 780* 745*    COAGS: Recent Labs    04/24/19 0357  INR 1.1    BMP: Recent Labs    04/22/19 0329 04/23/19 0439 04/25/19 0418 04/27/19 0535  NA 133* 133* 133* 128*  K 4.7 4.3 4.3 4.2  CL 102 102 99 92*  CO2 25 25 23 26   GLUCOSE 109* 105* 89 94  BUN 14 11 16 16   CALCIUM 8.0* 8.0* 8.8* 8.4*  CREATININE  0.72 0.65 0.99 0.87  GFRNONAA >60 >60 >60 >60  GFRAA >60 >60 >60 >60    LIVER FUNCTION TESTS: Recent Labs    04/11/19 1241 04/17/19 1350 04/17/19 1905  BILITOT 0.9 1.3* 1.2  AST 17 33 23  ALT 15 29 19   ALKPHOS 63 83 57  PROT 7.7 7.8 5.7*  ALBUMIN 3.8 3.2* 2.7*    Assessment and Plan: Pt with hx colonic perforation,  s/p recent cecectomy/end ileostomy, sig colectomy with mucous fistula; post op abd fluid collections, s/p perihepatic  fluid drain placement 1/22; afebrile; WBC 15(17.6), hgb 8.6(9.2), creat nl; drain fluid cx pend; cont current tx; check f/u CT later this week or as OP next week in IR clinic if discharged in next 24-48 hrs   Electronically Signed: D. Rowe Robert, PA-C 04/27/2019, 3:12 PM   I spent a total of 15 minutes  at the the patient's bedside AND on the patient's hospital floor or unit, greater than 50% of which was counseling/coordinating care for perihepatic drain    Patient ID: Daniel Mcmillan, male   DOB: 1970/11/26, 49 y.o.   MRN: [id number]

## 2019-04-27 NOTE — Progress Notes (Signed)
Patient ID: Daniel Mcmillan, male   DOB: Aug 28, 1970, 49 y.o.   MRN: [id number]    10 Days Post-Op  Subjective: Patient feels the best today he has felt thus far.  Tolerating a soft diet.  Ostomy output seems to be decreasing.  Mobilizing well.  Only complaint is dry nose from dry air.  ROS: See above, otherwise other systems negative  Objective: Vital signs in last 24 hours: Temp:  [97.5 F (36.4 C)-98.4 F (36.9 C)] 97.5 F (36.4 C) (01/25 0635) Pulse Rate:  [80-96] 80 (01/25 0635) Resp:  [14-18] 14 (01/25 0635) BP: (116-128)/(69-76) 128/73 (01/25 0635) SpO2:  [99 %-100 %] 100 % (01/25 0635) Last BM Date: 04/23/19  Intake/Output from previous day: 01/24 0701 - 01/25 0700 In: 797.5 [P.O.:600; I.V.:10; IV Piggyback:187.5] Out: 2495 [Urine:600; Drains:45; [id number] Intake/Output this shift: Total I/O In: -  Out: 300 [Urine:300]  PE: Abd: soft, still distended, but less so, ileostomy just emptied, but output is thicker for sure, stoma is viable, midline wound clean and packed.  Mucus fistula stoma viable with minimal output.  Right JP surgical still with some purulent output.  Left JP with mostly serous as well as right IR drain is mostly serosang as well.  Lab Results:  Recent Labs    04/26/19 0417 04/27/19 0535  WBC 17.6* 15.0*  HGB 9.2* 8.6*  HCT 28.3* 25.8*  PLT 780* 745*   BMET Recent Labs    04/25/19 0418 04/27/19 0535  NA 133* 128*  K 4.3 4.2  CL 99 92*  CO2 23 26  GLUCOSE 89 94  BUN 16 16  CREATININE 0.99 0.87  CALCIUM 8.8* 8.4*   PT/INR No results for input(s): LABPROT, INR in the last 72 hours. CMP     Component Value Date/Time   NA 128 (L) 04/27/2019 0535   K 4.2 04/27/2019 0535   CL 92 (L) 04/27/2019 0535   CO2 26 04/27/2019 0535   GLUCOSE 94 04/27/2019 0535   BUN 16 04/27/2019 0535   CREATININE 0.87 04/27/2019 0535   CALCIUM 8.4 (L) 04/27/2019 0535   PROT 5.7 (L) 04/17/2019 1905   ALBUMIN 2.7 (L) 04/17/2019 1905   AST 23 04/17/2019 1905     ALT 19 04/17/2019 1905   ALKPHOS 57 04/17/2019 1905   BILITOT 1.2 04/17/2019 1905   GFRNONAA >60 04/27/2019 0535   GFRAA >60 04/27/2019 0535   Lipase     Component Value Date/Time   LIPASE 16 04/11/2019 1241       Studies/Results: No results found.  Anti-infectives: Anti-infectives (From admission, onward)   Start     Dose/Rate Route Frequency Ordered Stop   04/17/19 2200  piperacillin-tazobactam (ZOSYN) IVPB 3.375 g     3.375 g 12.5 mL/hr over 240 Minutes Intravenous Every 8 hours 04/17/19 1825     04/17/19 1400  cefoTEtan (CEFOTAN) 2 g in sodium chloride 0.9 % 100 mL IVPB     2 g 200 mL/hr over 30 Minutes Intravenous On call to O.R. 04/17/19 1349 04/17/19 1520       Assessment/Plan ETOH abuse/use  Fluid overload - SLIV, received Lasix on Friday in between that and his ostomy output is now about 700 negative for yesterday  POD10, s/p ex lap with cecectomy and end ileostomy, sigmoid colectomy with mucous fistula, Dr. Kieth Brightly -Cystogram negative, Foley removed and he is voiding without difficulty. -cont zosyn, WBC remains elevated, but improving.  Multiple drains in place.   -IR drain with no growth to date -tolerating  soft diet  -mobilize TID-he is doing this in more on his own -pulm toilet and IS -WOC following the patient for teaching -BID dressing change to midline wound -fiber supplement for high ileostomy output  FEN -monitor closely, ostomy output seems to be improving.  Add salt tab for hyponatremia VTE -lovenox ID -zosyn for 5 days after last drain placed then will stop.   LOS: 10 days    Henreitta Cea , [facility] 04/27/2019, 9:51 AM Please see Amion for pager number during day hours 7:00am-4:30pm or 7:00am -11:30am on weekends

## 2019-04-28 ENCOUNTER — Encounter (HOSPITAL_COMMUNITY): Payer: Self-pay

## 2019-04-28 LAB — BASIC METABOLIC PANEL
Anion gap: 11 (ref 5–15)
BUN: 13 mg/dL (ref 6–20)
CO2: 26 mmol/L (ref 22–32)
Calcium: 8.4 mg/dL — ABNORMAL LOW (ref 8.9–10.3)
Chloride: 93 mmol/L — ABNORMAL LOW (ref 98–111)
Creatinine, Ser: 0.89 mg/dL (ref 0.61–1.24)
GFR calc Af Amer: >60 mL/min (ref >60)
GFR calc non Af Amer: >60 mL/min (ref >60)
Glucose, Bld: 89 mg/dL (ref 70–99)
Potassium: 4.3 mmol/L (ref 3.5–5.1)
Sodium: 130 mmol/L — ABNORMAL LOW (ref 135–145)

## 2019-04-28 LAB — CBC
HCT: 24.9 % — ABNORMAL LOW (ref 39.0–52.0)
Hemoglobin: 8.3 g/dL — ABNORMAL LOW (ref 13.0–17.0)
MCH: 27.4 pg (ref 26.0–34.0)
MCHC: 33.3 g/dL (ref 30.0–36.0)
MCV: 82.2 fL (ref 80.0–100.0)
Platelets: 751 10*3/uL — ABNORMAL HIGH (ref 150–400)
RBC: 3.03 MIL/uL — ABNORMAL LOW (ref 4.22–5.81)
RDW: 14.3 % (ref 11.5–15.5)
WBC: 13.1 10*3/uL — ABNORMAL HIGH (ref 4.0–10.5)
nRBC: 0 % (ref 0.0–0.2)

## 2019-04-28 MED ORDER — LOPERAMIDE HCL 2 MG PO CAPS: 2.0000 mg | ORAL_CAPSULE | Freq: Four times a day (QID) | ORAL | Status: DC | PRN

## 2019-04-28 MED ORDER — LOPERAMIDE HCL 2 MG PO CAPS: 2.0000 mg | ORAL_CAPSULE | Freq: Three times a day (TID) | ORAL | Status: DC | PRN

## 2019-04-28 MED ORDER — FERROUS SULFATE 325 (65 FE) MG PO TABS
325.0000 mg | ORAL_TABLET | Freq: Two times a day (BID) | ORAL | Status: DC
  Administered 2019-04-28 – 2019-04-29 (×3): 325 mg via ORAL
  Filled 2019-04-28 (×3): qty 1

## 2019-04-28 MED ORDER — GABAPENTIN 100 MG PO CAPS
100.0000 mg | ORAL_CAPSULE | Freq: Two times a day (BID) | ORAL | Status: DC
  Administered 2019-04-28 – 2019-04-29 (×3): 100 mg via ORAL
  Filled 2019-04-28 (×3): qty 1

## 2019-04-28 MED ORDER — METHOCARBAMOL 500 MG PO TABS
1000.0000 mg | ORAL_TABLET | Freq: Three times a day (TID) | ORAL | Status: DC
  Administered 2019-04-28 – 2019-04-29 (×3): 1000 mg via ORAL
  Filled 2019-04-28 (×3): qty 2

## 2019-04-28 MED ORDER — AMOXICILLIN-POT CLAVULANATE 875-125 MG PO TABS
1.0000 | ORAL_TABLET | Freq: Two times a day (BID) | ORAL | Status: DC
  Administered 2019-04-28 – 2019-04-29 (×3): 1 via ORAL
  Filled 2019-04-28 (×3): qty 1

## 2019-04-28 MED ORDER — LOPERAMIDE HCL 2 MG PO CAPS
4.0000 mg | ORAL_CAPSULE | Freq: Two times a day (BID) | ORAL | Status: DC
  Administered 2019-04-28 – 2019-04-29 (×3): 4 mg via ORAL
  Filled 2019-04-28 (×3): qty 2

## 2019-04-28 NOTE — Progress Notes (Addendum)
Patient is able to articulate pouch dressing changes and can demonstrate pouch emptying independently.

## 2019-04-28 NOTE — Progress Notes (Signed)
Patient ID: Daniel Mcmillan, male   DOB: [birth year], 49 y.o.   MRN: [id number]    11 Days Post-Op  Subjective: No new complaints today.  Tolerating his diet.  Eating about half of his plate.  Pain is controlled and he is eating well.  ROS: See above, otherwise other systems negative  Objective: Vital signs in last 24 hours: Temp:  [97.7 F (36.5 C)-98 F (36.7 C)] 98 F (36.7 C) (01/26 0448) Pulse Rate:  [85-97] 97 (01/26 0829) Resp:  [16-20] 16 (01/26 0829) BP: (128-159)/(82-91) 159/91 (01/26 0829) SpO2:  [99 %-100 %] 99 % (01/26 0829) Last BM Date: 04/28/19  Intake/Output from previous day: 01/25 0701 - 01/26 0700 In: 1160.6 [P.O.:890; IV Piggyback:270.6] Out: 3302 [Urine:1650; Drains:27; [id number] Intake/Output this shift: Total I/O In: -  Out: 215 [Drains:40; Stool:175]  PE: Abd: soft, appropriately tender, +BS, midline wound is clean and packed, left JP with just minimal serous output.  Right surgical still with some cloudy output, but is thinner today that is has been, IR R drain with minimal serosang output as well.  Ostomy pouches currently being changed.  Stomas are both pink and viable.  Lab Results:  Recent Labs    04/27/19 0535 04/28/19 0319  WBC 15.0* 13.1*  HGB 8.6* 8.3*  HCT 25.8* 24.9*  PLT 745* 751*   BMET Recent Labs    04/27/19 0535 04/28/19 0319  NA 128* 130*  K 4.2 4.3  CL 92* 93*  CO2 26 26  GLUCOSE 94 89  BUN 16 13  CREATININE 0.87 0.89  CALCIUM 8.4* 8.4*   PT/INR No results for input(s): LABPROT, INR in the last 72 hours. CMP     Component Value Date/Time   NA 130 (L) 04/28/2019 0319   K 4.3 04/28/2019 0319   CL 93 (L) 04/28/2019 0319   CO2 26 04/28/2019 0319   GLUCOSE 89 04/28/2019 0319   BUN 13 04/28/2019 0319   CREATININE 0.89 04/28/2019 0319   CALCIUM 8.4 (L) 04/28/2019 0319   PROT 5.7 (L) 04/17/2019 1905   ALBUMIN 2.7 (L) 04/17/2019 1905   AST 23 04/17/2019 1905   ALT 19 04/17/2019 1905   ALKPHOS 57 04/17/2019 1905   BILITOT 1.2 04/17/2019 1905   GFRNONAA >60 04/28/2019 0319   GFRAA >60 04/28/2019 0319   Lipase     Component Value Date/Time   LIPASE 16 04/11/2019 1241       Studies/Results: No results found.  Anti-infectives: Anti-infectives (From admission, onward)   Start     Dose/Rate Route Frequency Ordered Stop   04/28/19 1200  amoxicillin-clavulanate (AUGMENTIN) 875-125 MG per tablet 1 tablet     1 tablet Oral Every 12 hours 04/28/19 0724 04/30/19 0959   04/17/19 2200  piperacillin-tazobactam (ZOSYN) IVPB 3.375 g  Status:  Discontinued     3.375 g 12.5 mL/hr over 240 Minutes Intravenous Every 8 hours 04/17/19 1825 04/28/19 0724   04/17/19 1400  cefoTEtan (CEFOTAN) 2 g in sodium chloride 0.9 % 100 mL IVPB     2 g 200 mL/hr over 30 Minutes Intravenous On call to O.R. 04/17/19 1349 04/17/19 1520       Assessment/Plan ETOH abuse/use  Fluid overload - SLIV,patient still negative secondary to UOP and ostomy output.  Cr still 0.89  POD11, s/p ex lap with cecectomy and end ileostomy, sigmoid colectomy with mucous fistula, Dr. Kieth Brightly -Cystogram negative, Foley removed and he is voiding without difficulty. -switch from zosyn to augmentin today for 2 more days.  WBC down to 13K. -IR drain with no growth to date -tolerating soft diet  -mobilize TID-he is doing this in more on his own -pulm toilet and IS -WOC following the patient for teaching -BID dressing change to midline wound -fiber supplement, imodium, Fe for high ileostomy output -will attempt arrangement of HH if able to set this up. -DC left-sided JP drain  FEN -monitor closely, ostomy output seems to be improving.  Add salt tab for hyponatremia VTE -lovenox ID -Augmentin with DC stop date 1/27.   LOS: 11 days    Henreitta Cea , [facility] 04/28/2019, 10:35 AM Please see Amion for pager number during day hours 7:00am-4:30pm or 7:00am -11:30am on weekends

## 2019-04-28 NOTE — Progress Notes (Signed)
Referring Physician(s): Connor,C  Supervising Physician: Arne Cleveland  Patient Status:  Daniel Mcmillan - In-pt  Chief Complaint: abd fluid collections   Subjective: Pt doing fairly well this am; has some mouth soreness; denies N/V   Allergies: Patient has no known allergies.  Medications: Prior to Admission medications   Medication Sig Start Date End Date Taking? Authorizing Provider  acetaminophen (TYLENOL) 325 MG tablet You can take plain Tylenol 2 tablets every 4 hours as needed for pain.  You can alternate this with ibuprofen, or the Tylenol with codeine. DO NOT TAKE MORE THAN 4000 MG OF TYLENOL PER DAY.  IT CAN HARM YOUR LIVER.  TYLENOL (ACETAMINOPHEN) IS ALSO IN YOUR PRESCRIPTION PAIN MEDICATION.  YOU HAVE TO COUNT IT IN YOUR DAILY TOTAL. Patient taking differently: Take 650 mg by mouth every 4 (four) hours as needed.  05/17/17  Yes Earnstine Regal, PA-C  ciprofloxacin (CIPRO) 500 MG tablet Take 500 mg by mouth 2 (two) times daily. 04/15/19  Yes [provider]  ibuprofen (ADVIL,MOTRIN) 200 MG tablet You can take 2-3 tablets every 6 hours as needed for pain.  You can alternate this with plain Tylenol or the Tylenol 3.  You can buy this over-the-counter at any drugstore. Patient taking differently: Take 400-600 mg by mouth every 6 (six) hours as needed for moderate pain.  05/17/17  Yes Earnstine Regal, PA-C  metroNIDAZOLE (FLAGYL) 500 MG tablet Take 500 mg by mouth 3 (three) times daily. 04/15/19  Yes [provider]  saccharomyces boulardii (FLORASTOR) 250 MG capsule You can buy this over-the-counter at any drugstore.  Follow package directions.  Use this until you have completed your course of oral antibiotics. 05/17/17  Yes Earnstine Regal, PA-C  acetaminophen-codeine (TYLENOL #3) 300-30 MG tablet Take 1-2 tablets by mouth every 6 (six) hours as needed (moderate pain not relieved by ibuprofen). Patient not taking: Reported on 04/17/2019 05/17/17   Earnstine Regal, PA-C  amoxicillin-clavulanate (AUGMENTIN) 875-125 MG tablet Take 1 tablet by mouth every 12 (twelve) hours. Patient not taking: Reported on 04/17/2019 05/17/17   Earnstine Regal, PA-C  lisinopril (PRINIVIL,ZESTRIL) 20 MG tablet Take 1 tablet daily.  Check your blood pressure at home at least 2 times per day and record.  Take this information to your primary care physician.  You should follow-up with primary care physician in 2-3 weeks.  We will not refill this prescription. Patient not taking: Reported on 04/17/2019 05/17/17   Earnstine Regal, PA-C  sodium chloride flush (NS) 0.9 % SOLN Flush your drain twice a day with these, as instructed by Radiology. 05/17/17   Earnstine Regal, PA-C     Vital Signs: BP (!) 159/91 (BP Location: Right Arm)   Pulse 97   Temp 98 F (36.7 C) (Oral)   Resp 16   Ht 5\' 10"  (1.778 m)   Wt 147 lb (66.7 kg)   SpO2 99%   BMI 21.09 kg/m   Physical Exam awake/alert; RUQ abd drain intact, insertion site ok, minimal tenderness, OP 40 cc serous fluid  Imaging: DG Cystogram  Result Date: 04/24/2019 CLINICAL DATA:  Diverticulitis, concern for bladder leak EXAM: CYSTOGRAM TECHNIQUE: After catheterization of the urinary bladder following sterile technique the bladder was filled with 250 mL Cysto-Conray 17.2% by drip infusion. Serial spot images were obtained during bladder filling. FLUOROSCOPY TIME:  Fluoroscopy Time:  0.4 minutes Radiation Exposure Index (if provided by the fluoroscopic device): 6.4 mGy COMPARISON:  None. FINDINGS: There is no evidence of contrast extravasation. There are  no diverticula. No intraluminal filling defects apart from Foley balloon. There was no reflux into the ureters. Normal emptying. IMPRESSION: No evidence of leak. Electronically Signed   By: Macy Mis M.D.   On: 04/24/2019 15:09    Labs:  CBC: Recent Labs    04/25/19 0418 04/26/19 0417 04/27/19 0535 04/28/19 0319  WBC 15.7* 17.6* 15.0* 13.1*  HGB 9.6* 9.2* 8.6*  8.3*  HCT 27.9* 28.3* 25.8* 24.9*  PLT 694* 780* 745* 751*    COAGS: Recent Labs    04/24/19 0357  INR 1.1    BMP: Recent Labs    04/23/19 0439 04/25/19 0418 04/27/19 0535 04/28/19 0319  NA 133* 133* 128* 130*  K 4.3 4.3 4.2 4.3  CL 102 99 92* 93*  CO2 25 23 26 26   GLUCOSE 105* 89 94 89  BUN 11 16 16 13   CALCIUM 8.0* 8.8* 8.4* 8.4*  CREATININE 0.65 0.99 0.87 0.89  GFRNONAA >60 >60 >60 >60  GFRAA >60 >60 >60 >60    LIVER FUNCTION TESTS: Recent Labs    04/11/19 1241 04/17/19 1350 04/17/19 1905  BILITOT 0.9 1.3* 1.2  AST 17 33 23  ALT 15 29 19   ALKPHOS 63 83 57  PROT 7.7 7.8 5.7*  ALBUMIN 3.8 3.2* 2.7*    Assessment and Plan: Pt with hx colonic perforation,  s/p recent cecectomy/end ileostomy, sig colectomy with mucous fistula; post op abd fluid collections, s/p perihepatic fluid drain placement 1/22; afebrile; WBC 13.1(15), hgb 8.3(8.6), drain fluid cx pend; check f/u CT later this week or as OP next week in IR clinic if discharged in next 1-2 days   Electronically Signed: D. Rowe Robert, PA-C 04/28/2019, 11:12 AM   I spent a total of 15 minutes at the the patient's bedside AND on the patient's hospital floor or unit, greater than 50% of which was counseling/coordinating care for right upper abdominal drain    Patient ID: Daniel Mcmillan, male   DOB: September 19, 1970, 49 y.o.   MRN: [id number]

## 2019-04-28 NOTE — Consult Note (Signed)
[place] Nurse ostomy follow up Patient asked bedside RN to change pouches today and while he would not physically participate, he was able to direct the step-by-step instructions. Bedside RN and I had a lengthy discussion about the need for patient to demonstrate independence prior to discharge.  It is decided that she will return to patient's room and observe him changing the mucus fistula pouch since both pouching procedures are the same. Independence in pouch emptying has already been established. She will document following pouch change procedure and comment on patient's level of independence. Patient has adequate supply at the bedside for two ostomies (ileostomy and mucus fistula). [place] nursing team will follow while in house, and will remain available to this patient, the nursing and medical teams.   Thanks, Maudie Flakes, MSN, RN, [place], Arther Abbott  Pager# [phone]

## 2019-04-28 NOTE — TOC Initial Note (Signed)
Transition of Care [facility]) - Initial/Assessment Note    Patient Details  Name: Daniel Mcmillan MRN: [id number] Date of Birth: 22-Sep-1970  Transition of Care [facility]) CM/SW Contact:    Lia Hopping, [place] Phone Number: 04/28/2019, 5:30 PM  Clinical Narrative:                 CSW met with the patient at bedside to discuss home health options for RN. CSW provided list of H. Health. CSW explain the Home Health agencies in the patient service area are not able to provide RN services at this time.CSW explain the reasons listed below.  Patient report understanding.   [facility]- no staffing available [facility]-no staffing available  [facility] no staffing [facility] staffing [facility] staffing.  [facility]- no staffing [facility]-no staffing available   Patient reports that he helped his father care for his "ostomy bag" for 7 years. He reports, " it is not the bag I am worried about, it's my weight loss." Patient reports he has lost 20 pounds. He is looking forward to his discharge. Patient reports his sisters and Elenor Legato will assist with his care at home if/ when needed.  Per nurse, the patient has been demonstrating pouch emptying independently.    Expected Discharge Plan: [place] Services Barriers to Discharge: Other (comment)   Patient Goals and CMS Choice Patient states their goals for this hospitalization and ongoing recovery are:: "I am not afraid of the bag, I just wnat to gain my weight back and get better" CMS Medicare.gov Compare Post Acute Care list provided to:: Patient Choice offered to / list presented to : Patient  Expected Discharge Plan and Services Expected Discharge Plan: [place] In-house Referral: Clinical Social Work Discharge Planning Services: CM Consult Post Acute Care Choice: [place] arrangements for the past 2 months: Single Family Home                                       Prior Living Arrangements/Services Living arrangements for the past 2 months: Single Family Home Lives with:: Self Patient language and need for interpreter reviewed:: No Do you feel safe going back to the place where you live?: Yes        Care giver support system in place?: Yes (comment)(Siblings and Aunt -BSN)   Criminal Activity/Legal Involvement Pertinent to Current Situation/Hospitalization: No - Comment as needed  Activities of Daily Living Home Assistive Devices/Equipment: None ADL Screening (condition at time of admission) Patient's cognitive ability adequate to safely complete daily activities?: Yes Is the patient deaf or have difficulty hearing?: No Does the patient have difficulty seeing, even when wearing glasses/contacts?: No Does the patient have difficulty concentrating, remembering, or making decisions?: No Patient able to express need for assistance with ADLs?: Yes Does the patient have difficulty dressing or bathing?: Yes Independently performs ADLs?: Yes (appropriate for developmental age) Does the patient have difficulty walking or climbing stairs?: Yes Weakness of Legs: None Weakness of Arms/Hands: None  Permission Sought/Granted Permission sought to share information with : Case Manager                Emotional Assessment Appearance:: Appears stated age   Affect (typically observed): Accepting Orientation: : Oriented to Self, Oriented to Place, Oriented to  Time, Oriented to Situation Alcohol / Substance Use: Not Applicable Psych Involvement:  No (comment)  Admission diagnosis:  Perforated viscus [R19.8] Perforated abdominal viscus [R19.8] Patient Active Problem List   Diagnosis Date Noted  . 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 Colon obstruction ([place]) 04/19/2019  . Perforation of cecum from colon obstruction s/p ileocectomy/ileostomy/mucus fistula 04/17/2019 04/19/2019  .  Colovesical fistula s/p LAR resection 04/17/2019 04/19/2019  . Fecal peritonitis from cecal perforation 04/19/2019  . 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 Descending colon mucous fistula 04/17/2019 04/19/2019  . Essential hypertension 04/19/2019  . Protein-calorie malnutrition, severe ([place]) 04/19/2019  . Alcohol dependence ([place])   . Diverticulosis   . Diverticulitis of large intestine with perforation and abscess 05/09/2017   PCP:  Patient, No Pcp Per Pharmacy:   [facility], [place] [address] [place] [address] Phone: [phone] Fax: [phone]     Social Determinants of Health (SDOH) Interventions    Readmission Risk Interventions No flowsheet data found.

## 2019-04-29 ENCOUNTER — Other Ambulatory Visit: Payer: Self-pay | Admitting: General Surgery

## 2019-04-29 DIAGNOSIS — K7689 Other specified diseases of liver: Secondary | ICD-10-CM

## 2019-04-29 LAB — AEROBIC/ANAEROBIC CULTURE W GRAM STAIN (SURGICAL/DEEP WOUND)
Culture: NO GROWTH
Special Requests: NORMAL

## 2019-04-29 MED ORDER — OXYCODONE HCL 10 MG PO TABS: 5.0000 mg | ORAL_TABLET | Freq: Four times a day (QID) | ORAL | 0 refills | Status: AC | PRN

## 2019-04-29 MED ORDER — METHOCARBAMOL 500 MG PO TABS: 1000.0000 mg | ORAL_TABLET | Freq: Three times a day (TID) | ORAL | 0 refills | Status: AC

## 2019-04-29 MED ORDER — CALCIUM POLYCARBOPHIL 625 MG PO TABS: 625.0000 mg | ORAL_TABLET | Freq: Every day | ORAL | Status: AC

## 2019-04-29 MED ORDER — FLUCONAZOLE 100 MG PO TABS: 100.0000 mg | ORAL_TABLET | Freq: Every day | ORAL | 0 refills | Status: AC

## 2019-04-29 MED ORDER — SODIUM CHLORIDE 0.9% FLUSH: 5.0000 mL | Freq: Every day | INTRAVENOUS | 1 refills | Status: AC

## 2019-04-29 MED ORDER — GABAPENTIN 100 MG PO CAPS: 100.0000 mg | ORAL_CAPSULE | Freq: Two times a day (BID) | ORAL | 0 refills | Status: AC

## 2019-04-29 MED ORDER — ACETAMINOPHEN 500 MG PO TABS: 1000.0000 mg | ORAL_TABLET | Freq: Four times a day (QID) | ORAL | 0 refills | Status: AC | PRN

## 2019-04-29 MED ORDER — LOPERAMIDE HCL 2 MG PO CAPS: 4.0000 mg | ORAL_CAPSULE | Freq: Two times a day (BID) | ORAL | 0 refills | Status: AC

## 2019-04-29 MED ORDER — MAGIC MOUTHWASH
5.0000 mL | Freq: Four times a day (QID) | ORAL | Status: DC
  Administered 2019-04-29: 5 mL via ORAL
  Filled 2019-04-29 (×4): qty 5

## 2019-04-29 NOTE — Discharge Instructions (Signed)
Pack wound with normal saline wet to dry dressing changes to midline wound daily.  Take imodium 2 mg in the morning and at bedtime.  Take 4mg  of imodium 2-3 times during the day for output over 1L of fluid.   Colostomy Home Guide, Adult  Colostomy surgery is done to create an opening in the front of the abdomen for stool (feces) to leave the body through an ostomy (stoma). Part of the large intestine is attached to the stoma. A bag, also called a pouch, is fitted over the stoma. Stool and gas will collect in the bag. After surgery, you will need to empty and change your colostomy bag as needed. You will also need to care for your stoma. How to care for the stoma Your stoma should look pink, red, and moist, like the inside of your cheek. Soon after surgery, the stoma may be swollen, but this swelling will go away within 6 weeks. To care for the stoma:  Keep the skin around the stoma clean and dry.  Use a clean, soft washcloth to gently wash the stoma and the skin around it. Clean using a circular motion, and wipe away from the stoma opening, not toward it. ? Use warm water and only use cleansers recommended by your health care provider. ? Rinse the stoma area with plain water. ? Dry the area around the stoma well.  Use stoma powder or ointment on your skin only as told by your health care provider. Do not use any other powders, gels, wipes, or creams on the skin around the stoma.  Check the stoma area every day for signs of infection. Check for: ? New or worsening redness, swelling, or pain. ? New or increased fluid or blood. ? Pus or warmth.  Measure the stoma opening regularly and record the size. Watch for changes. (It is normal for the stoma to get smaller as swelling goes away.) Share this information with your health care provider. How to empty the colostomy bag  Empty your bag at bedtime and whenever it is one-third to one-half full. Do not let the bag get more than half-full with  stool or gas. The bag could leak if it gets too full. Some colostomy bags have a built-in gas release valve that releases gas often throughout the day. Follow these basic steps: 1. Wash your hands with soap and water. 2. Sit far back on the toilet seat. 3. Put several pieces of toilet paper into the toilet water. This will prevent splashing as you empty stool into the toilet. 4. Remove the clip or the hook-and-loop fastener from the tail end of the bag. 5. Unroll the tail, then empty the stool into the toilet. 6. Clean the tail with toilet paper or a moist towelette. 7. Reroll the tail, and close it with the clip or the hook-and-loop fastener. 8. Wash your hands again. How to change the colostomy bag Change your bag every 3-4 days or as often as told by your health care provider. Also change the bag if it is leaking or separating from the skin, or if your skin around the stoma looks or feels irritated. Irritated skin may be a sign that the bag is leaking. Always have colostomy supplies with you, and follow these basic steps: 1. Wash your hands with soap and water. Have paper towels or tissues nearby to clean any discharge. 2. Remove the old bag and skin barrier. Use your fingers or a warm cloth to gently push the skin  away from the barrier. 3. Clean the stoma area with water or with mild soap and water, as directed. Use water to rinse away any soap. 4. Dry the skin. You may use the cool setting on a hair dryer to do this. 5. Use a tracing pattern (template) to cut the skin barrier to the size needed. 6. If you are using a two-piece bag, attach the bag and the skin barrier to each other. Add the barrier ring, if you use one. 7. If directed, apply stoma powder or skin barrier gel to the skin. 8. Warm the skin barrier with your hands, or blow with a hair dryer for 5-10 seconds. 9. Remove the paper from the adhesive strip of the skin barrier. 10. Press the adhesive strip onto the skin around the  stoma. 11. Gently rub the skin barrier onto the skin. This creates heat that helps the barrier to stick. 12. Apply stoma tape to the edges of the skin barrier, if desired. 57. Wash your hands again. General recommendations  Avoid wearing tight clothes or having anything press directly on your stoma or bag. Change your clothing whenever it is soiled or damp.  You may shower or bathe with the bag on or off. Do not use harsh or oily soaps or lotions. Dry the skin and bag after bathing.  Store all supplies in a cool, dry place. Do not leave supplies in extreme heat because some parts can melt or not stick as well.  Whenever you leave home, take extra clothing and an extra skin barrier and bag with you.  If your bag gets wet, you can dry it with a hair dryer on the cool setting.  To prevent odor, you may put drops of ostomy deodorizer in the bag.  If recommended by your health care provider, put ostomy lubricant inside the bag. This helps stool to slide out of the bag more easily and completely. Contact a health care provider if:  You have new or worsening redness, swelling, or pain around your stoma.  You have new or increased fluid or blood coming from your stoma.  Your stoma feels warm to the touch.  You have pus coming from your stoma.  Your stoma extends in or out farther than normal.  You need to change your bag every day.  You have a fever. Get help right away if:  Your stool is bloody.  You have nausea or you vomit.  You have trouble breathing. Summary  Measure your stoma opening regularly and record the size. Watch for changes.  Empty your bag at bedtime and whenever it is one-third to one-half full. Do not let the bag get more than half-full with stool or gas.  Change your bag every 3-4 days or as often as told by your health care provider.  Whenever you leave home, take extra clothing and an extra skin barrier and bag with you. This information is not intended  to replace advice given to you by your health care provider. Make sure you discuss any questions you have with your health care provider. Document Revised: 07/09/2018 Document Reviewed: 09/12/2016 Elsevier Patient Education  [place].   Percutaneous Abscess Drain, Care After This sheet gives you information about how to care for yourself after your procedure. Your health care provider may also give you more specific instructions. If you have problems or questions, contact your health care provider. What can I expect after the procedure? After your procedure, it is common to have:  A small amount of bruising and discomfort in the area where the drainage tube (catheter) was placed.  Sleepiness and fatigue. This should go away after the medicines you were given have worn off. Follow these instructions at home: Incision care  Follow instructions from your health care provider about how to take care of your incision. Make sure you: ? Wash your hands with soap and water before you change your bandage (dressing). If soap and water are not available, use hand sanitizer. ? Change your dressing as told by your health care provider. ? Leave stitches (sutures), skin glue, or adhesive strips in place. These skin closures may need to stay in place for 2 weeks or longer. If adhesive strip edges start to loosen and curl up, you may trim the loose edges. Do not remove adhesive strips completely unless your health care provider tells you to do that.  Check your incision area every day for signs of infection. Check for: ? More redness, swelling, or pain. ? More fluid or blood. ? Warmth. ? Pus or a bad smell. ? Fluid leaking from around your catheter (instead of fluid draining through your catheter). Catheter care   Follow instructions from your health care provider about emptying and cleaning your catheter and collection bag. You may need to clean the catheter every day so it does not  clog.  If directed, write down the following information every time you empty your bag: ? The date and time. ? The amount of drainage. General instructions  Rest at home for 1-2 days after your procedure. Return to your normal activities as told by your health care provider.  Do not take baths, swim, or use a hot tub for 24 hours after your procedure, or until your health care provider says that this is okay.  Take over-the-counter and prescription medicines only as told by your health care provider.  Keep all follow-up visits as told by your health care provider. This is important. Contact a health care provider if:  You have less than 10 mL of drainage a day for 2-3 days in a row, or as directed by your health care provider.  You have more redness, swelling, or pain around your incision area.  You have more fluid or blood coming from your incision area.  Your incision area feels warm to the touch.  You have pus or a bad smell coming from your incision area.  You have fluid leaking from around your catheter (instead of through your catheter).  You have a fever or chills.  You have pain that does not get better with medicine. Get help right away if:  Your catheter comes out.  You suddenly stop having drainage from your catheter.  You suddenly have blood in the fluid that is draining from your catheter.  You become dizzy or you faint.  You develop a rash.  You have nausea or vomiting.  You have difficulty breathing or you feel short of breath.  You develop chest pain.  You have problems with your speech or vision.  You have trouble balancing or moving your arms or legs. Summary  It is common to have a small amount of bruising and discomfort in the area where the drainage tube (catheter) was placed.  You may be directed to record the amount of drainage from the bag every time you empty it.  Follow instructions from your health care provider about emptying and  cleaning your catheter and collection bag. This information is not intended  to replace advice given to you by your health care provider. Make sure you discuss any questions you have with your health care provider. Document Revised: 03/01/2017 Document Reviewed: 02/09/2016 Elsevier Patient Education  2020 [employer].

## 2019-04-29 NOTE — Progress Notes (Signed)
Referring Physician(s): Georgiann Cocker  Supervising Physician: Corrie Mckusick  Patient Status:  Daniel Mcmillan - In-pt  Chief Complaint: Abdominal Pain with fluid collection   Subjective: Patient is doing well today. He reports no pain and states he is ready to go home. Pt denies any fever, chills, N/V, chest pain or SOB. He denies any leakage or drainage around the drain site. Approximately 1 cc of serosanguinous fluid in the drain at time of inspection.   Allergies: Patient has no known allergies.  Medications: Prior to Admission medications   Medication Sig Start Date End Date Taking? Authorizing Provider  acetaminophen (TYLENOL) 325 MG tablet You can take plain Tylenol 2 tablets every 4 hours as needed for pain.  You can alternate this with ibuprofen, or the Tylenol with codeine. DO NOT TAKE MORE THAN 4000 MG OF TYLENOL PER DAY.  IT CAN HARM YOUR LIVER.  TYLENOL (ACETAMINOPHEN) IS ALSO IN YOUR PRESCRIPTION PAIN MEDICATION.  YOU HAVE TO COUNT IT IN YOUR DAILY TOTAL. Patient taking differently: Take 650 mg by mouth every 4 (four) hours as needed.  05/17/17  Yes Earnstine Regal, PA-C  ciprofloxacin (CIPRO) 500 MG tablet Take 500 mg by mouth 2 (two) times daily. 04/15/19  Yes [provider]  ibuprofen (ADVIL,MOTRIN) 200 MG tablet You can take 2-3 tablets every 6 hours as needed for pain.  You can alternate this with plain Tylenol or the Tylenol 3.  You can buy this over-the-counter at any drugstore. Patient taking differently: Take 400-600 mg by mouth every 6 (six) hours as needed for moderate pain.  05/17/17  Yes Earnstine Regal, PA-C  metroNIDAZOLE (FLAGYL) 500 MG tablet Take 500 mg by mouth 3 (three) times daily. 04/15/19  Yes [provider]  saccharomyces boulardii (FLORASTOR) 250 MG capsule You can buy this over-the-counter at any drugstore.  Follow package directions.  Use this until you have completed your course of oral antibiotics. 05/17/17  Yes Earnstine Regal, PA-C    acetaminophen (TYLENOL) 500 MG tablet Take 2 tablets (1,000 mg total) by mouth every 6 (six) hours as needed. 04/29/19   Saverio Danker, PA-C  acetaminophen-codeine (TYLENOL #3) 300-30 MG tablet Take 1-2 tablets by mouth every 6 (six) hours as needed (moderate pain not relieved by ibuprofen). Patient not taking: Reported on 04/17/2019 05/17/17   Earnstine Regal, PA-C  amoxicillin-clavulanate (AUGMENTIN) 875-125 MG tablet Take 1 tablet by mouth every 12 (twelve) hours. Patient not taking: Reported on 04/17/2019 05/17/17   Earnstine Regal, PA-C  fluconazole (DIFLUCAN) 100 MG tablet Take 1 tablet (100 mg total) by mouth daily for 7 days. 04/29/19 05/06/19  Saverio Danker, PA-C  gabapentin (NEURONTIN) 100 MG capsule Take 1 capsule (100 mg total) by mouth 2 (two) times daily. 04/29/19   Saverio Danker, PA-C  lisinopril (PRINIVIL,ZESTRIL) 20 MG tablet Take 1 tablet daily.  Check your blood pressure at home at least 2 times per day and record.  Take this information to your primary care physician.  You should follow-up with primary care physician in 2-3 weeks.  We will not refill this prescription. Patient not taking: Reported on 04/17/2019 05/17/17   Earnstine Regal, PA-C  loperamide (IMODIUM) 2 MG capsule Take 2 capsules (4 mg total) by mouth 2 (two) times daily. 04/29/19   Saverio Danker, PA-C  methocarbamol (ROBAXIN) 500 MG tablet Take 2 tablets (1,000 mg total) by mouth 3 (three) times daily. 04/29/19   Saverio Danker, PA-C  oxyCODONE 10 MG TABS Take 0.5-1 tablets (5-10 mg total) by mouth  every 6 (six) hours as needed for moderate pain or severe pain (5mg  for moderate pain, 10mg  for severe pain). 04/29/19   Saverio Danker, PA-C  polycarbophil (FIBERCON) 625 MG tablet Take 1 tablet (625 mg total) by mouth daily. 04/30/19   Saverio Danker, PA-C  sodium chloride flush (NS) 0.9 % SOLN Flush your drain twice a day with these, as instructed by Radiology. 05/17/17   Earnstine Regal, PA-C  sodium chloride flush (NS)  0.9 % SOLN 5 mLs by Intracatheter route daily. 04/29/19   Saverio Danker, PA-C     Vital Signs: BP 138/87 (BP Location: Left Arm)   Pulse 88   Temp 98 F (36.7 C) (Oral)   Resp 16   Ht 5\' 10"  (1.778 m)   Wt 147 lb (66.7 kg)   SpO2 100%   BMI 21.09 kg/m   Physical Exam Constitutional:      General: He is not in acute distress. Cardiovascular:     Rate and Rhythm: Normal rate and regular rhythm.     Heart sounds: Normal heart sounds.  Pulmonary:     Effort: Pulmonary effort is normal.     Breath sounds: Normal breath sounds.  Abdominal:     General: Bowel sounds are normal. There is no distension.     Palpations: Abdomen is soft.     Tenderness: There is no abdominal tenderness.     Comments: Drain site was clean and intact. Approximately 1 cc of serosanguinous fluid in the drain.   Skin:    General: Skin is warm and dry.  Neurological:     Mental Status: He is alert. Mental status is at baseline.     Imaging: No results found.  Labs:  CBC: Recent Labs    04/25/19 0418 04/26/19 0417 04/27/19 0535 04/28/19 0319  WBC 15.7* 17.6* 15.0* 13.1*  HGB 9.6* 9.2* 8.6* 8.3*  HCT 27.9* 28.3* 25.8* 24.9*  PLT 694* 780* 745* 751*    COAGS: Recent Labs    04/24/19 0357  INR 1.1    BMP: Recent Labs    04/23/19 0439 04/25/19 0418 04/27/19 0535 04/28/19 0319  NA 133* 133* 128* 130*  K 4.3 4.3 4.2 4.3  CL 102 99 92* 93*  CO2 25 23 26 26   GLUCOSE 105* 89 94 89  BUN 11 16 16 13   CALCIUM 8.0* 8.8* 8.4* 8.4*  CREATININE 0.65 0.99 0.87 0.89  GFRNONAA >60 >60 >60 >60  GFRAA >60 >60 >60 >60    LIVER FUNCTION TESTS: Recent Labs    04/11/19 1241 04/17/19 1350 04/17/19 1905  BILITOT 0.9 1.3* 1.2  AST 17 33 23  ALT 15 29 19   ALKPHOS 63 83 57  PROT 7.7 7.8 5.7*  ALBUMIN 3.8 3.2* 2.7*    Assessment and Plan: Patient s/p perihepatic drain placement on 1/22. No new labs or imaging have been obtained today. Vitals are stable. Pt can be discharged home today.  Pt will follow up with the clinic in 1 week to assess drain site.   Electronically Signed: D. Rowe Robert, PA-C/Claire Elenteny PA-S 04/29/2019, 2:29 PM   I spent a total of 15 minutes at the the patient's bedside AND on the patient's hospital floor or unit, greater than 50% of which was counseling/coordinating care for right upper abdominal fluid collection drain    Patient ID: Daniel Mcmillan, male   DOB: [birth year], 49 y.o.   MRN: [id number]

## 2019-04-29 NOTE — Discharge Summary (Signed)
Patient ID: Daniel Mcmillan [id number] [birth year] 49 y.o.  Admit date: 04/17/2019 Discharge date: 04/29/2019  Admitting Diagnosis: Pneumoperitoneum with multiple abscesses  Discharge Diagnosis Patient Active Problem List   Diagnosis Date Noted  . 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 Colon obstruction ([place]) 04/19/2019  . Perforation of cecum from colon obstruction s/p ileocectomy/ileostomy/mucus fistula 04/17/2019 04/19/2019  . Colovesical fistula s/p LAR resection 04/17/2019 04/19/2019  . Fecal peritonitis from cecal perforation 04/19/2019  . 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 Descending colon mucous fistula 04/17/2019 04/19/2019  . Essential hypertension 04/19/2019  . Protein-calorie malnutrition, severe ([place]) 04/19/2019  . Alcohol dependence ([place])   . Diverticulosis   . Diverticulitis of large intestine with perforation and abscess 05/09/2017    Consultants none  Reason for Admission: 49 yo male had lower abdominal pain over the last 4 days. 2 days ago he went to the ER but was not seen after several hours so left. He was in contact with a [facility] surgeon who started antibiotics and ordered a CT scan today. This was completed showing large amount of free air as well as multiple abscesses. He also reports more air and sediment in his urine. He has felt more bloating and similar pains to past diverticulitis.  Procedures right colectomy with end ileostomy, sigmoid colectomy with mucus fistula, Dr. Kieth Brightly 04/17/19  Hospital Course:  The patient was admitted and placed on zosyn and taken urgently to the OR for the above procedure.  The patient tolerated the procedure well and had an NGT placed.  He was initially sent to the ICU for post operative care.  On POD 3, his ileus began to resolve as he started having output in his ileostomy.  His NGT was removed.  He then developed some bloating and was  found to have a persistent ileus.  An NGT was not replaced and he improved and ultimately his diet was able to be advanced as tolerated.  He was transferred out of the unit on POD 3 to the floor.  He mobilized well.  WOC was consulted for ostomy teaching and care. His 2 surgical drains remained stable and the left was able to be removed prior to discharge.  His left drain turned purulent around POD 6.  His WBC increased around this time as well.  A CT scan was ordered which revealed a fluid collection around his liver.  This was drained by IR.  This did not grow anything.  His zosyn was discontinued after 12 days.  The patient otherwise remained medically stable otherwise.  He was stable on POD 12 for discharge home with 2 right-sided drains in place, an ileostomy, mucus fistula, and an open midline wound.  [place] services were unable to be set up as no agencies would accept him.  He will follow up with IR and with [facility].  Physical Exam: Heart: regular Lungs: CTAB Abd: soft, appropriately tender, midline wound is clean and packed, ileostomy working well with good output, down to about 1250cc/day, mucus fistula with no current output.  Both stomas are pink and viable.  R surgical drain with tan purulent appearing output. IR drain with no output currently.  Allergies as of 04/29/2019   No Known Allergies     Medication List    STOP taking these medications   acetaminophen-codeine 300-30 MG tablet Commonly known as: TYLENOL #3   amoxicillin-clavulanate 875-125 MG tablet Commonly known as: AUGMENTIN   ciprofloxacin  500 MG tablet Commonly known as: CIPRO   metroNIDAZOLE 500 MG tablet Commonly known as: FLAGYL     TAKE these medications   acetaminophen 500 MG tablet Commonly known as: TYLENOL Take 2 tablets (1,000 mg total) by mouth every 6 (six) hours as needed. What changed:   medication strength  how much to take  how to take this  when to take this  reasons to take this  additional  instructions   fluconazole 100 MG tablet Commonly known as: Diflucan Take 1 tablet (100 mg total) by mouth daily for 7 days.   gabapentin 100 MG capsule Commonly known as: NEURONTIN Take 1 capsule (100 mg total) by mouth 2 (two) times daily.   ibuprofen 200 MG tablet Commonly known as: ADVIL You can take 2-3 tablets every 6 hours as needed for pain.  You can alternate this with plain Tylenol or the Tylenol 3.  You can buy this over-the-counter at any drugstore. What changed:   how much to take  how to take this  when to take this  reasons to take this  additional instructions   lisinopril 20 MG tablet Commonly known as: ZESTRIL Take 1 tablet daily.  Check your blood pressure at home at least 2 times per day and record.  Take this information to your primary care physician.  You should follow-up with primary care physician in 2-3 weeks.  We will not refill this prescription.   loperamide 2 MG capsule Commonly known as: IMODIUM Take 2 capsules (4 mg total) by mouth 2 (two) times daily.   methocarbamol 500 MG tablet Commonly known as: ROBAXIN Take 2 tablets (1,000 mg total) by mouth 3 (three) times daily.   Oxycodone HCl 10 MG Tabs Take 0.5-1 tablets (5-10 mg total) by mouth every 6 (six) hours as needed for moderate pain or severe pain (5mg  for moderate pain, 10mg  for severe pain).   polycarbophil 625 MG tablet Commonly known as: FIBERCON Take 1 tablet (625 mg total) by mouth daily. Start taking on: April 30, 2019   saccharomyces boulardii 250 MG capsule Commonly known as: Rosana Fret You can buy this over-the-counter at any drugstore.  Follow package directions.  Use this until you have completed your course of oral antibiotics.   sodium chloride flush 0.9 % Soln Commonly known as: NS 5 mLs by Intracatheter route daily. What changed:   how much to take  how to take this  when to take this  additional instructions        Follow-up Information    Sandi Mariscal, MD Follow up in 1 week(s).   Specialties: Interventional Radiology, Radiology Why: They will call you with follow up information Contact information: [address] [place] [address] [phone]        Kinsinger, Arta Bruce, MD Follow up in 3 week(s).   Specialty: General Surgery Why: our office will call you with appointment date and time Contact information: [place] [address] [phone]           Signed: Saverio Danker, [facility] Surgery 04/29/2019, 12:15 PM Please see Amion for pager number during day hours 7:00am-4:30pm

## 2019-05-13 ENCOUNTER — Ambulatory Visit
Admission: RE | Admit: 2019-05-13 | Discharge: 2019-05-13 | Disposition: A | Discharge status: Home / Self Care | Payer: BC Managed Care – PPO | Source: Ambulatory Visit | Attending: Radiology | Admitting: Radiology

## 2019-05-13 ENCOUNTER — Ambulatory Visit
Admission: RE | Admit: 2019-05-13 | Discharge: 2019-05-13 | Disposition: A | Discharge status: Home / Self Care | Payer: BC Managed Care – PPO | Source: Ambulatory Visit | Attending: General Surgery | Admitting: General Surgery

## 2019-05-13 ENCOUNTER — Encounter: Payer: Self-pay | Admitting: *Deleted

## 2019-05-13 DIAGNOSIS — K7689 Other specified diseases of liver: Secondary | ICD-10-CM

## 2019-05-13 HISTORY — PX: IR RADIOLOGIST EVAL & MGMT: IMG5224

## 2019-05-13 MED ORDER — IOPAMIDOL (ISOVUE-300) INJECTION 61%
100.0000 mL | Freq: Once | INTRAVENOUS | Status: AC | PRN
  Administered 2019-05-13: 100 mL via INTRAVENOUS

## 2019-05-13 NOTE — Progress Notes (Signed)
Chief Complaint: Patient was seen in consultation today for follow up perihepatic drain.  Referring Physician(s): Dr. Kieth Brightly  History of Present Illness: Daniel Mcmillan is a 49 y.o. male with complex surgical hx including partial colectomy with colostomy and ileostomy. This was complicated by post surgical abscesses. He underwent perc drain to perihepatic collection on 1/22 during his hospitalization. Since his discharge, he states he's done much better. Still reporting some drainage from the perc RUQ drain. Also reports that the surgical drain in his RLQ fell out but the suture remains. He's been flushing the RUQ drain and getting return of cloudy but scant. He is here today for follow up CT and eval, he's also to see his surgeon this afternoon.  Past Medical History:  Diagnosis Date  . Diverticulosis   . Essential hypertension 04/19/2019  . ETOH abuse     Past Surgical History:  Procedure Laterality Date  . COLECTOMY WITH COLOSTOMY CREATION/HARTMANN PROCEDURE  04/17/2019   LAR with descending colon mucus fistula  . ILEOCECETOMY  04/17/2019   Cecal perforation from sigmoid colon stricture  . ILEOSTOMY  04/17/2019  . INGUINAL HERNIA REPAIR    . IR RADIOLOGIST EVAL & MGMT  05/21/2017   Drainage diverticular abscess  . LAPAROTOMY N/A 04/17/2019   Procedure: EXPLORATORY LAPAROTOMY; RIGHT COLECTOMY WITH END ILEOSTOMY; LOW ANTERIOR RESECTION WITH END COLOSTOMY;  Surgeon: Kinsinger, Arta Bruce, MD;  Location: [facility];  Service: General;  Laterality: N/A;    Allergies: Patient has no known allergies.  Medications: Prior to Admission medications   Medication Sig Start Date End Date Taking? Authorizing Provider  acetaminophen (TYLENOL) 500 MG tablet Take 2 tablets (1,000 mg total) by mouth every 6 (six) hours as needed. 04/29/19   Saverio Danker, PA-C  gabapentin (NEURONTIN) 100 MG capsule Take 1 capsule (100 mg total) by mouth 2 (two) times daily. 04/29/19   Saverio Danker, PA-C   ibuprofen (ADVIL,MOTRIN) 200 MG tablet You can take 2-3 tablets every 6 hours as needed for pain.  You can alternate this with plain Tylenol or the Tylenol 3.  You can buy this over-the-counter at any drugstore. Patient taking differently: Take 400-600 mg by mouth every 6 (six) hours as needed for moderate pain.  05/17/17   Earnstine Regal, PA-C  lisinopril (PRINIVIL,ZESTRIL) 20 MG tablet Take 1 tablet daily.  Check your blood pressure at home at least 2 times per day and record.  Take this information to your primary care physician.  You should follow-up with primary care physician in 2-3 weeks.  We will not refill this prescription. Patient not taking: Reported on 04/17/2019 05/17/17   Earnstine Regal, PA-C  loperamide (IMODIUM) 2 MG capsule Take 2 capsules (4 mg total) by mouth 2 (two) times daily. 04/29/19   Saverio Danker, PA-C  methocarbamol (ROBAXIN) 500 MG tablet Take 2 tablets (1,000 mg total) by mouth 3 (three) times daily. 04/29/19   Saverio Danker, PA-C  oxyCODONE 10 MG TABS Take 0.5-1 tablets (5-10 mg total) by mouth every 6 (six) hours as needed for moderate pain or severe pain (5mg  for moderate pain, 10mg  for severe pain). 04/29/19   Saverio Danker, PA-C  polycarbophil (FIBERCON) 625 MG tablet Take 1 tablet (625 mg total) by mouth daily. 04/30/19   Saverio Danker, PA-C  saccharomyces boulardii (FLORASTOR) 250 MG capsule You can buy this over-the-counter at any drugstore.  Follow package directions.  Use this until you have completed your course of oral antibiotics. 05/17/17   Earnstine Regal, PA-C  sodium chloride flush (NS) 0.9 % SOLN 5 mLs by Intracatheter route daily. 04/29/19   Saverio Danker, PA-C     No family history on file.  Social History   Socioeconomic History  . Marital status: Single    Spouse name: Not on file  . Number of children: Not on file  . Years of education: Not on file  . Highest education level: Not on file  Occupational History  . Not on file  Tobacco  Use  . Smoking status: Never Smoker  . Smokeless tobacco: Never Used  Substance and Sexual Activity  . Alcohol use: Yes    Alcohol/week: 42.0 standard drinks    Types: 42 Cans of beer per week    Comment: daily  . Drug use: Yes    Types: Marijuana  . Sexual activity: Yes  Other Topics Concern  . Not on file  Social History Narrative  . Not on file   Social Determinants of Health   Financial Resource Strain:   . Difficulty of Paying Living Expenses: Not on file  Food Insecurity:   . Worried About Charity fundraiser in the Last Year: Not on file  . Ran Out of Food in the Last Year: Not on file  Transportation Needs:   . Lack of Transportation (Medical): Not on file  . Lack of Transportation (Non-Medical): Not on file  Physical Activity:   . Days of Exercise per Week: Not on file  . Minutes of Exercise per Session: Not on file  Stress:   . Feeling of Stress : Not on file  Social Connections:   . Frequency of Communication with Friends and Family: Not on file  . Frequency of Social Gatherings with Friends and Family: Not on file  . Attends Religious Services: Not on file  . Active Member of Clubs or Organizations: Not on file  . Attends Archivist Meetings: Not on file  . Marital Status: Not on file    Review of Systems: A 12 point ROS discussed and pertinent positives are indicated in the HPI above.  All other systems are negative.  Review of Systems  Vital Signs: There were no vitals taken for this visit.  Physical Exam RLQ suture remains at site of surgical drain that fell out. Suture removed and dressing applied. RUQ drain intact, site clean, [place] Output scant but cloudy.   Imaging: CT today shows interval improvement since last imaging. Few small collections remain but interval reduction in size. The superior perihepatic collection is essentially resolved around the drain which remained well positioned. See full report   Labs:  CBC: Recent Labs     04/25/19 0418 04/26/19 0417 04/27/19 0535 04/28/19 0319  WBC 15.7* 17.6* 15.0* 13.1*  HGB 9.6* 9.2* 8.6* 8.3*  HCT 27.9* 28.3* 25.8* 24.9*  PLT 694* 780* 745* 751*    COAGS: Recent Labs    04/24/19 0357  INR 1.1    BMP: Recent Labs    04/23/19 0439 04/25/19 0418 04/27/19 0535 04/28/19 0319  NA 133* 133* 128* 130*  K 4.3 4.3 4.2 4.3  CL 102 99 92* 93*  CO2 25 23 26 26   GLUCOSE 105* 89 94 89  BUN 11 16 16 13   CALCIUM 8.0* 8.8* 8.4* 8.4*  CREATININE 0.65 0.99 0.87 0.89  GFRNONAA >60 >60 >60 >60  GFRAA >60 >60 >60 >60    LIVER FUNCTION TESTS: Recent Labs    04/11/19 1241 04/17/19 1350 04/17/19 1905  BILITOT 0.9 1.3*  1.2  AST 17 33 23  ALT 15 29 19   ALKPHOS 63 83 57  PROT 7.7 7.8 5.7*  ALBUMIN 3.8 3.2* 2.7*    TUMOR MARKERS: No results for input(s): AFPTM, CEA, CA199, CHROMGRNA in the last 8760 hours.  Assessment and Plan: Post surgical perihepatic abscess S/p perc drain 1/22 Imaging today and clinical evaluation are consistent with resolution of this abscess. After review of imaging and case with Dr. Laurence Ferrari, drain removed without difficulty. He is to see his surgeon this afternoon.  Thank you for this interesting consult.  I greatly enjoyed meeting Daniel Mcmillan and look forward to participating in their care.  A copy of this report was sent to the requesting provider on this date.  Electronically Signed: Ascencion Dike 05/13/2019, 11:28 AM   I spent a total of 15 minutes in face to face in clinical consultation, greater than 50% of which was counseling/coordinating care for perihepatic abscess drain

## 2019-08-04 ENCOUNTER — Ambulatory Visit: Payer: Self-pay | Admitting: General Surgery

## 2019-08-20 ENCOUNTER — Encounter (HOSPITAL_COMMUNITY): Payer: Self-pay

## 2019-08-20 NOTE — Patient Instructions (Signed)
DUE TO COVID-19 ONLY ONE VISITOR IS ALLOWED TO COME WITH YOU AND STAY IN THE WAITING ROOM ONLY DURING PRE OP AND PROCEDURE DAY OF SURGERY. THE 2 VISITORS MAY VISIT WITH YOU AFTER SURGERY IN YOUR PRIVATE ROOM DURING VISITING HOURS ONLY!  YOU NEED TO HAVE A COVID 19 TEST ON_5/29/21______ @_12 :30______, THIS TEST MUST BE DONE BEFORE SURGERY, COME  [name] , [id number].  ([place]) ONCE YOUR COVID TEST IS COMPLETED, PLEASE BEGIN THE QUARANTINE INSTRUCTIONS AS OUTLINED IN YOUR HANDOUT.                Daniel Mcmillan     Your procedure is scheduled on: 09/03/19   Report to [facility] Main  Entrance   Report to Short Stay at 5:30  AM     Call this number if you have problems the morning of surgery [phone]   Follow instructions for bowel prep. Drink plenty of fluids on prep day to prevent dehydration.   Marland Kitchen BRUSH YOUR TEETH MORNING OF SURGERY AND RINSE YOUR MOUTH OUT, NO CHEWING GUM CANDY OR MINTS.  DRINK 2 PRESURGERY ENSURE DRINKS THE NIGHT BEFORE SURGERY AT 10:00 PM .   NO SOLIDS AFTER MIDNIGHT THE DAY PRIOR TO THE SURGERY  . NOTHING BY MOUTH EXCEPT CLEAR LIQUIDS UNTIL THREE HOURS PRIOR TO SCHEDULED SURGERY.   PLEASE FINISH PRESURGERY ENSURE DRINK PER SURGEON ORDER 3 HOURS PRIOR TO SCHEDULED SURGERY TIME WHICH NEEDS TO BE COMPLETED AT _4:30 AM________.     Take these medicines the morning of surgery with A SIP OF WATER: Prilosec                                 You may not have any metal on your body including              piercings              Do not wear jewelry, lotions, powders or  deodorant                    Men may shave face and neck.   Do not bring valuables to the hospital. [place].  Contacts, dentures or bridgework may not be worn into surgery.      Name and phone number of your driver:  Special Instructions: N/A              Please read over the following fact sheets  you were given: _____________________________________________________________________             [facility] - Preparing for Surgery Before surgery, you can play an important role.   Because skin is not sterile, your skin needs to be as free of germs as possible.   You can reduce the number of germs on your skin by washing with CHG (chlorahexidine gluconate) soap before surgery .  CHG is an antiseptic cleaner which kills germs and bonds with the skin to continue killing germs even after washing. Please DO NOT use if you have an allergy to CHG or antibacterial soaps .  If your skin becomes reddened/irritated stop using the CHG and inform your nurse when you arrive at Short Stay.  You may shave your face/neck.  Please follow these instructions carefully:  1.  Shower with CHG  Soap the night before surgery and the  morning of Surgery.  2.  If you choose to wash your hair, wash your hair first as usual with your  normal  shampoo.  3.  After you shampoo, rinse your hair and body thoroughly to remove the  shampoo.                                        4.  Use CHG as you would any other liquid soap.  You can apply chg directly  to the skin and wash                       Gently with a scrungie or clean washcloth.  5.  Apply the CHG Soap to your body ONLY FROM THE NECK DOWN.   Do not use on face/ open                           Wound or open sores. Avoid contact with eyes, ears mouth and genitals (private parts).                       Wash face,  Genitals (private parts) with your normal soap.             6.  Wash thoroughly, paying special attention to the area where your surgery  will be performed.  7.  Thoroughly rinse your body with warm water from the neck down.  8.  DO NOT shower/wash with your normal soap after using and rinsing off  the CHG Soap.             9.  Pat yourself dry with a clean towel.            10.  Wear clean pajamas.            11.  Place clean sheets on your bed the  night of your first shower and do not  sleep with pets. Day of Surgery : Do not apply any lotions/deodorants the morning of surgery.  Please wear clean clothes to the hospital/surgery center.  FAILURE TO FOLLOW THESE INSTRUCTIONS MAY RESULT IN THE CANCELLATION OF YOUR SURGERY PATIENT SIGNATURE_________________________________  NURSE SIGNATURE__________________________________  ________________________________________________________________________   Daniel Mcmillan  An incentive spirometer is a tool that can help keep your lungs clear and active. This tool measures how well you are filling your lungs with each breath. Taking long deep breaths may help reverse or decrease the chance of developing breathing (pulmonary) problems (especially infection) following:  A long period of time when you are unable to move or be active. BEFORE THE PROCEDURE   If the spirometer includes an indicator to show your best effort, your nurse or respiratory therapist will set it to a desired goal.  If possible, sit up straight or lean slightly forward. Try not to slouch.  Hold the incentive spirometer in an upright position. INSTRUCTIONS FOR USE  1. Sit on the edge of your bed if possible, or sit up as far as you can in bed or on a chair. 2. Hold the incentive spirometer in an upright position. 3. Breathe out normally. 4. Place the mouthpiece in your mouth and seal your lips tightly around it. 5. Breathe in slowly and as deeply as possible, raising the piston or the ball toward  the top of the column. 6. Hold your breath for 3-5 seconds or for as long as possible. Allow the piston or ball to fall to the bottom of the column. 7. Remove the mouthpiece from your mouth and breathe out normally. 8. Rest for a few seconds and repeat Steps 1 through 7 at least 10 times every 1-2 hours when you are awake. Take your time and take a few normal breaths between deep breaths. 9. The spirometer may include an  indicator to show your best effort. Use the indicator as a goal to work toward during each repetition. 10. After each set of 10 deep breaths, practice coughing to be sure your lungs are clear. If you have an incision (the cut made at the time of surgery), support your incision when coughing by placing a pillow or rolled up towels firmly against it. Once you are able to get out of bed, walk around indoors and cough well. You may stop using the incentive spirometer when instructed by your caregiver.  RISKS AND COMPLICATIONS  Take your time so you do not get dizzy or light-headed.  If you are in pain, you may need to take or ask for pain medication before doing incentive spirometry. It is harder to take a deep breath if you are having pain. AFTER USE  Rest and breathe slowly and easily.  It can be helpful to keep track of a log of your progress. Your caregiver can provide you with a simple table to help with this. If you are using the spirometer at home, follow these instructions: [place] IF:   You are having difficultly using the spirometer.  You have trouble using the spirometer as often as instructed.  Your pain medication is not giving enough relief while using the spirometer.  You develop fever of 100.5 F (38.1 C) or higher. SEEK IMMEDIATE MEDICAL CARE IF:   You cough up bloody sputum that had not been present before.  You develop fever of 102 F (38.9 C) or greater.  You develop worsening pain at or near the incision site. MAKE SURE YOU:   Understand these instructions.  Will watch your condition.  Will get help right away if you are not doing well or get worse. Document Released: 07/30/2006 Document Revised: 06/11/2011 Document Reviewed: 09/30/2006 [facility] Patient Information 2014 [place], [place].   ________________________________________________________________________

## 2019-08-21 ENCOUNTER — Other Ambulatory Visit: Payer: Self-pay

## 2019-08-21 ENCOUNTER — Encounter (HOSPITAL_COMMUNITY)
Admission: RE | Admit: 2019-08-21 | Discharge: 2019-08-21 | Disposition: A | Discharge status: Home / Self Care | Payer: BC Managed Care – PPO | Source: Ambulatory Visit | Attending: General Surgery | Admitting: General Surgery

## 2019-08-21 ENCOUNTER — Encounter (HOSPITAL_COMMUNITY): Payer: Self-pay

## 2019-08-21 DIAGNOSIS — Z01818 Encounter for other preprocedural examination: Secondary | ICD-10-CM | POA: Diagnosis not present

## 2019-08-21 NOTE — Progress Notes (Addendum)
PCP - Dr. Kieth Brightly Cardiologist - no  Chest x-ray - no EKG - 08/26/19 Stress Test -no ECHO - no Cardiac Cath - no  Sleep Study - no CPAP -   Fasting Blood Sugar - NA Checks Blood Sugar _____ times a day  Blood Thinner Instructions:NA Aspirin Instructions: Last Dose:  Anesthesia review:   Patient denies shortness of breath, fever, cough and chest pain at PAT appointment yes  Patient verbalized understanding of instructions that were given to them at the PAT appointment. Patient was also instructed that they will need to review over the PAT instructions again at home before surgery. Yes   Pt has stopped taking most of his meds. He said that his BP went up while in the hospital but it has been normal since the first surgery.

## 2019-08-26 ENCOUNTER — Encounter (HOSPITAL_COMMUNITY)
Admission: RE | Admit: 2019-08-26 | Discharge: 2019-08-26 | Disposition: A | Discharge status: Home / Self Care | Payer: BC Managed Care – PPO | Source: Ambulatory Visit | Attending: General Surgery | Admitting: General Surgery

## 2019-08-26 ENCOUNTER — Other Ambulatory Visit: Payer: Self-pay

## 2019-08-26 DIAGNOSIS — Z01818 Encounter for other preprocedural examination: Secondary | ICD-10-CM | POA: Insufficient documentation

## 2019-08-26 DIAGNOSIS — I10 Essential (primary) hypertension: Secondary | ICD-10-CM | POA: Diagnosis not present

## 2019-08-26 LAB — CBC WITH DIFFERENTIAL/PLATELET
Abs Immature Granulocytes: 0.01 10*3/uL (ref 0.00–0.07)
Basophils Absolute: 0.1 10*3/uL (ref 0.0–0.1)
Basophils Relative: 1 %
Eosinophils Absolute: 0.2 10*3/uL (ref 0.0–0.5)
Eosinophils Relative: 4 %
HCT: 43.5 % (ref 39.0–52.0)
Hemoglobin: 14.2 g/dL (ref 13.0–17.0)
Immature Granulocytes: 0 %
Lymphocytes Relative: 45 %
Lymphs Abs: 2.9 10*3/uL (ref 0.7–4.0)
MCH: 27.8 pg (ref 26.0–34.0)
MCHC: 32.6 g/dL (ref 30.0–36.0)
MCV: 85.1 fL (ref 80.0–100.0)
Monocytes Absolute: 0.6 10*3/uL (ref 0.1–1.0)
Monocytes Relative: 10 %
Neutro Abs: 2.5 10*3/uL (ref 1.7–7.7)
Neutrophils Relative %: 40 %
Platelets: 251 10*3/uL (ref 150–400)
RBC: 5.11 MIL/uL (ref 4.22–5.81)
RDW: 14.1 % (ref 11.5–15.5)
WBC: 6.3 10*3/uL (ref 4.0–10.5)
nRBC: 0 % (ref 0.0–0.2)

## 2019-08-26 LAB — COMPREHENSIVE METABOLIC PANEL
ALT: 23 U/L (ref 0–44)
AST: 29 U/L (ref 15–41)
Albumin: 3.9 g/dL (ref 3.5–5.0)
Alkaline Phosphatase: 86 U/L (ref 38–126)
Anion gap: 9 (ref 5–15)
BUN: 13 mg/dL (ref 6–20)
CO2: 20 mmol/L — ABNORMAL LOW (ref 22–32)
Calcium: 8.8 mg/dL — ABNORMAL LOW (ref 8.9–10.3)
Chloride: 107 mmol/L (ref 98–111)
Creatinine, Ser: 0.86 mg/dL (ref 0.61–1.24)
GFR calc Af Amer: >60 mL/min (ref >60)
GFR calc non Af Amer: >60 mL/min (ref >60)
Glucose, Bld: 79 mg/dL (ref 70–99)
Potassium: 4 mmol/L (ref 3.5–5.1)
Sodium: 136 mmol/L (ref 135–145)
Total Bilirubin: 0.4 mg/dL (ref 0.3–1.2)
Total Protein: 7.6 g/dL (ref 6.5–8.1)

## 2019-08-26 LAB — HEMOGLOBIN A1C
Hgb A1c MFr Bld: 5.9 % — ABNORMAL HIGH (ref 4.8–5.6)
Mean Plasma Glucose: 122.63 mg/dL

## 2019-08-26 LAB — ABO/RH: ABO/RH(D): A NEG

## 2019-08-29 ENCOUNTER — Inpatient Hospital Stay (HOSPITAL_COMMUNITY)
Admission: RE | Admit: 2019-08-29 | Discharge: 2019-08-29 | Disposition: A | Discharge status: Home / Self Care | Payer: BC Managed Care – PPO | Source: Ambulatory Visit

## 2019-08-29 NOTE — Progress Notes (Signed)
LVM for pt not to show up for covid testing today 5/29, as he is too early for his procedure on 6/3. Pt appt will be rescheduled for Tues 6/. Please call [phone] for any questions.

## 2019-09-01 ENCOUNTER — Other Ambulatory Visit (HOSPITAL_COMMUNITY)
Admission: RE | Admit: 2019-09-01 | Payer: BC Managed Care – PPO | Source: Ambulatory Visit | Attending: General Surgery | Admitting: General Surgery

## 2019-09-02 ENCOUNTER — Other Ambulatory Visit (HOSPITAL_COMMUNITY)
Admission: RE | Admit: 2019-09-02 | Discharge: 2019-09-02 | Disposition: A | Discharge status: Home / Self Care | Payer: BC Managed Care – PPO | Source: Ambulatory Visit | Attending: General Surgery | Admitting: General Surgery

## 2019-09-02 DIAGNOSIS — Z01812 Encounter for preprocedural laboratory examination: Secondary | ICD-10-CM | POA: Insufficient documentation

## 2019-09-02 DIAGNOSIS — Z20822 Contact with and (suspected) exposure to covid-19: Secondary | ICD-10-CM | POA: Insufficient documentation

## 2019-09-02 LAB — SARS CORONAVIRUS 2 (TAT 6-24 HRS): SARS Coronavirus 2: NEGATIVE

## 2019-09-02 MED ORDER — BUPIVACAINE LIPOSOME 1.3 % IJ SUSP
20.0000 mL | Freq: Once | INTRAMUSCULAR | Status: DC
  Filled 2019-09-02: qty 20

## 2019-09-02 NOTE — Anesthesia Preprocedure Evaluation (Addendum)
Anesthesia Evaluation  Patient identified by MRN, date of birth, ID band Patient awake    Reviewed: Allergy & Precautions, NPO status , Patient's Chart, lab work & pertinent test results  History of Anesthesia Complications Negative for: history of anesthetic complications  Airway Mallampati: II  TM Distance: >3 FB Neck ROM: Full    Dental no notable dental hx.    Pulmonary neg pulmonary ROS, Patient abstained from smoking.,    Pulmonary exam normal        Cardiovascular hypertension, Normal cardiovascular exam     Neuro/Psych negative neurological ROS  negative psych ROS   GI/Hepatic GERD  Medicated and Controlled,(+)     substance abuse (42 drinks per week)  alcohol use, Diverticulosis   Endo/Other  negative endocrine ROS  Renal/GU negative Renal ROS  negative genitourinary   Musculoskeletal negative musculoskeletal ROS (+)   Abdominal   Peds  Hematology negative hematology ROS (+)   Anesthesia Other Findings Day of surgery medications reviewed with patient.  Reproductive/Obstetrics negative OB ROS                            Anesthesia Physical Anesthesia Plan  ASA: II  Anesthesia Plan: General   Post-op Pain Management:    Induction: Intravenous  PONV Risk Score and Plan: 4 or greater and Treatment may vary due to age or medical condition, Ondansetron, Dexamethasone and Midazolam  Airway Management Planned: Oral ETT  Additional Equipment: None  Intra-op Plan:   Post-operative Plan: Extubation in OR  Informed Consent: I have reviewed the patients History and Physical, chart, labs and discussed the procedure including the risks, benefits and alternatives for the proposed anesthesia with the patient or authorized representative who has indicated his/her understanding and acceptance.     Dental advisory given  Plan Discussed with: CRNA  Anesthesia Plan Comments:         Anesthesia Quick Evaluation

## 2019-09-03 ENCOUNTER — Inpatient Hospital Stay (HOSPITAL_COMMUNITY): Payer: BC Managed Care – PPO | Admitting: Anesthesiology

## 2019-09-03 ENCOUNTER — Other Ambulatory Visit: Payer: Self-pay

## 2019-09-03 ENCOUNTER — Inpatient Hospital Stay (HOSPITAL_COMMUNITY)
Admission: RE | Admit: 2019-09-03 | Discharge: 2019-09-07 | DRG: 331 | Disposition: A | Discharge status: Home / Self Care | Payer: BC Managed Care – PPO | Attending: General Surgery | Admitting: General Surgery

## 2019-09-03 ENCOUNTER — Encounter (HOSPITAL_COMMUNITY): Payer: Self-pay | Admitting: General Surgery

## 2019-09-03 ENCOUNTER — Encounter (HOSPITAL_COMMUNITY)
Admission: RE | Disposition: A | Discharge status: Home / Self Care | Payer: Self-pay | Source: Home / Self Care | Attending: General Surgery

## 2019-09-03 DIAGNOSIS — Z8719 Personal history of other diseases of the digestive system: Secondary | ICD-10-CM

## 2019-09-03 DIAGNOSIS — K219 Gastro-esophageal reflux disease without esophagitis: Secondary | ICD-10-CM | POA: Diagnosis present

## 2019-09-03 DIAGNOSIS — Z432 Encounter for attention to ileostomy: Secondary | ICD-10-CM | POA: Diagnosis present

## 2019-09-03 DIAGNOSIS — Z20822 Contact with and (suspected) exposure to covid-19: Secondary | ICD-10-CM | POA: Diagnosis present

## 2019-09-03 DIAGNOSIS — Z9049 Acquired absence of other specified parts of digestive tract: Secondary | ICD-10-CM | POA: Diagnosis not present

## 2019-09-03 DIAGNOSIS — K660 Peritoneal adhesions (postprocedural) (postinfection): Secondary | ICD-10-CM | POA: Diagnosis present

## 2019-09-03 DIAGNOSIS — Z433 Encounter for attention to colostomy: Secondary | ICD-10-CM

## 2019-09-03 DIAGNOSIS — Z79899 Other long term (current) drug therapy: Secondary | ICD-10-CM

## 2019-09-03 HISTORY — PX: PROCTOSCOPY: SHX2266

## 2019-09-03 HISTORY — PX: ILEOSTOMY CLOSURE: SHX1784

## 2019-09-03 LAB — TYPE AND SCREEN
ABO/RH(D): A NEG
Antibody Screen: NEGATIVE

## 2019-09-03 LAB — CBC
HCT: 46 % (ref 39.0–52.0)
Hemoglobin: 14.9 g/dL (ref 13.0–17.0)
MCH: 27.8 pg (ref 26.0–34.0)
MCHC: 32.4 g/dL (ref 30.0–36.0)
MCV: 85.8 fL (ref 80.0–100.0)
Platelets: 251 10*3/uL (ref 150–400)
RBC: 5.36 MIL/uL (ref 4.22–5.81)
RDW: 13.9 % (ref 11.5–15.5)
WBC: 9.2 10*3/uL (ref 4.0–10.5)
nRBC: 0 % (ref 0.0–0.2)

## 2019-09-03 LAB — CREATININE, SERUM
Creatinine, Ser: 1.08 mg/dL (ref 0.61–1.24)
GFR calc Af Amer: >60 mL/min (ref >60)
GFR calc non Af Amer: >60 mL/min (ref >60)

## 2019-09-03 SURGERY — CLOSURE, ILEOSTOMY
Anesthesia: General

## 2019-09-03 MED ORDER — OXYCODONE HCL 5 MG PO TABS
5.0000 mg | ORAL_TABLET | Freq: Four times a day (QID) | ORAL | Status: DC | PRN
  Administered 2019-09-03 – 2019-09-07 (×10): 5 mg via ORAL
  Filled 2019-09-03 (×11): qty 1

## 2019-09-03 MED ORDER — ONDANSETRON HCL 4 MG/2ML IJ SOLN
INTRAMUSCULAR | Status: DC | PRN
  Administered 2019-09-03 (×2): 4 mg via INTRAVENOUS

## 2019-09-03 MED ORDER — HYDROMORPHONE HCL 1 MG/ML IJ SOLN
INTRAMUSCULAR | Status: AC
  Filled 2019-09-03: qty 1

## 2019-09-03 MED ORDER — MORPHINE SULFATE (PF) 2 MG/ML IV SOLN
2.0000 mg | INTRAVENOUS | Status: DC | PRN
  Administered 2019-09-03: 4 mg via INTRAVENOUS
  Administered 2019-09-03 – 2019-09-05 (×10): 2 mg via INTRAVENOUS
  Administered 2019-09-05 – 2019-09-06 (×2): 4 mg via INTRAVENOUS
  Administered 2019-09-06: 2 mg via INTRAVENOUS
  Administered 2019-09-06 (×2): 4 mg via INTRAVENOUS
  Administered 2019-09-06: 2 mg via INTRAVENOUS
  Administered 2019-09-06 – 2019-09-07 (×2): 4 mg via INTRAVENOUS
  Filled 2019-09-03: qty 2
  Filled 2019-09-03 (×2): qty 1
  Filled 2019-09-03: qty 2
  Filled 2019-09-03 (×6): qty 1
  Filled 2019-09-03 (×2): qty 2
  Filled 2019-09-03: qty 1
  Filled 2019-09-03: qty 2
  Filled 2019-09-03 (×2): qty 1
  Filled 2019-09-03 (×2): qty 2
  Filled 2019-09-03: qty 1

## 2019-09-03 MED ORDER — LIDOCAINE 2% (20 MG/ML) 5 ML SYRINGE
INTRAMUSCULAR | Status: DC | PRN
  Administered 2019-09-03: 1.5 mg/kg/h via INTRAVENOUS
  Administered 2019-09-03: 80 mg via INTRAVENOUS

## 2019-09-03 MED ORDER — METOPROLOL TARTRATE 5 MG/5ML IV SOLN: 5.0000 mg | Freq: Four times a day (QID) | INTRAVENOUS | Status: DC | PRN

## 2019-09-03 MED ORDER — FENTANYL CITRATE (PF) 100 MCG/2ML IJ SOLN
INTRAMUSCULAR | Status: AC
  Filled 2019-09-03: qty 2

## 2019-09-03 MED ORDER — SODIUM CHLORIDE 0.45 % IV SOLN: INTRAVENOUS | Status: DC

## 2019-09-03 MED ORDER — GABAPENTIN 300 MG PO CAPS
300.0000 mg | ORAL_CAPSULE | ORAL | Status: AC
  Administered 2019-09-03: 300 mg via ORAL
  Filled 2019-09-03: qty 1

## 2019-09-03 MED ORDER — ENOXAPARIN SODIUM 40 MG/0.4ML ~~LOC~~ SOLN
40.0000 mg | SUBCUTANEOUS | Status: DC
  Administered 2019-09-04 – 2019-09-07 (×4): 40 mg via SUBCUTANEOUS
  Filled 2019-09-03 (×4): qty 0.4

## 2019-09-03 MED ORDER — PROPOFOL 10 MG/ML IV BOLUS
INTRAVENOUS | Status: AC
  Filled 2019-09-03: qty 20

## 2019-09-03 MED ORDER — METOPROLOL TARTRATE 5 MG/5ML IV SOLN
5.0000 mg | Freq: Four times a day (QID) | INTRAVENOUS | Status: DC
  Administered 2019-09-03 – 2019-09-07 (×16): 5 mg via INTRAVENOUS
  Filled 2019-09-03 (×15): qty 5

## 2019-09-03 MED ORDER — LACTATED RINGERS IV SOLN: INTRAVENOUS | Status: DC

## 2019-09-03 MED ORDER — LIDOCAINE 2% (20 MG/ML) 5 ML SYRINGE
INTRAMUSCULAR | Status: AC
  Filled 2019-09-03: qty 5

## 2019-09-03 MED ORDER — NEOMYCIN SULFATE 500 MG PO TABS: 1000.0000 mg | ORAL_TABLET | ORAL | Status: DC

## 2019-09-03 MED ORDER — ALVIMOPAN 12 MG PO CAPS
12.0000 mg | ORAL_CAPSULE | Freq: Two times a day (BID) | ORAL | Status: DC
  Administered 2019-09-04 – 2019-09-05 (×4): 12 mg via ORAL
  Filled 2019-09-03 (×5): qty 1

## 2019-09-03 MED ORDER — FENTANYL CITRATE (PF) 100 MCG/2ML IJ SOLN
INTRAMUSCULAR | Status: DC | PRN
  Administered 2019-09-03: 100 ug via INTRAVENOUS
  Administered 2019-09-03: 50 ug via INTRAVENOUS
  Administered 2019-09-03 (×2): 25 ug via INTRAVENOUS
  Administered 2019-09-03 (×2): 50 ug via INTRAVENOUS
  Administered 2019-09-03: 25 ug via INTRAVENOUS
  Administered 2019-09-03: 50 ug via INTRAVENOUS
  Administered 2019-09-03: 25 ug via INTRAVENOUS
  Administered 2019-09-03: 50 ug via INTRAVENOUS

## 2019-09-03 MED ORDER — METRONIDAZOLE 500 MG PO TABS: 1000.0000 mg | ORAL_TABLET | ORAL | Status: DC

## 2019-09-03 MED ORDER — ORAL CARE MOUTH RINSE: 15.0000 mL | Freq: Once | OROMUCOSAL | Status: AC

## 2019-09-03 MED ORDER — GABAPENTIN 300 MG PO CAPS
300.0000 mg | ORAL_CAPSULE | Freq: Two times a day (BID) | ORAL | Status: DC
  Administered 2019-09-03 – 2019-09-07 (×8): 300 mg via ORAL
  Filled 2019-09-03 (×8): qty 1

## 2019-09-03 MED ORDER — 0.9 % SODIUM CHLORIDE (POUR BTL) OPTIME
TOPICAL | Status: DC | PRN
  Administered 2019-09-03 (×2): 2000 mL

## 2019-09-03 MED ORDER — SUGAMMADEX SODIUM 200 MG/2ML IV SOLN
INTRAVENOUS | Status: DC | PRN
  Administered 2019-09-03: 200 mg via INTRAVENOUS

## 2019-09-03 MED ORDER — SODIUM CHLORIDE 0.9 % IV SOLN
2.0000 g | INTRAVENOUS | Status: AC
  Administered 2019-09-03: 2 g via INTRAVENOUS
  Filled 2019-09-03: qty 2

## 2019-09-03 MED ORDER — ROCURONIUM BROMIDE 10 MG/ML (PF) SYRINGE
PREFILLED_SYRINGE | INTRAVENOUS | Status: AC
  Filled 2019-09-03: qty 10

## 2019-09-03 MED ORDER — ROCURONIUM BROMIDE 10 MG/ML (PF) SYRINGE
PREFILLED_SYRINGE | INTRAVENOUS | Status: DC | PRN
  Administered 2019-09-03 (×2): 10 mg via INTRAVENOUS
  Administered 2019-09-03: 50 mg via INTRAVENOUS
  Administered 2019-09-03 (×2): 10 mg via INTRAVENOUS
  Administered 2019-09-03 (×2): 20 mg via INTRAVENOUS

## 2019-09-03 MED ORDER — DEXAMETHASONE SODIUM PHOSPHATE 10 MG/ML IJ SOLN
INTRAMUSCULAR | Status: DC | PRN
  Administered 2019-09-03: 8 mg via INTRAVENOUS

## 2019-09-03 MED ORDER — KETOROLAC TROMETHAMINE 30 MG/ML IJ SOLN
15.0000 mg | INTRAMUSCULAR | Status: AC
  Administered 2019-09-03: 15 mg via INTRAVENOUS
  Filled 2019-09-03: qty 1

## 2019-09-03 MED ORDER — EPHEDRINE SULFATE-NACL 50-0.9 MG/10ML-% IV SOSY
PREFILLED_SYRINGE | INTRAVENOUS | Status: DC | PRN
  Administered 2019-09-03 (×2): 5 mg via INTRAVENOUS

## 2019-09-03 MED ORDER — ONDANSETRON HCL 4 MG/2ML IJ SOLN
INTRAMUSCULAR | Status: AC
  Filled 2019-09-03: qty 2

## 2019-09-03 MED ORDER — ONDANSETRON HCL 4 MG/2ML IJ SOLN: 4.0000 mg | Freq: Four times a day (QID) | INTRAMUSCULAR | Status: DC | PRN

## 2019-09-03 MED ORDER — LIDOCAINE HCL 2 % IJ SOLN
INTRAMUSCULAR | Status: AC
  Filled 2019-09-03: qty 20

## 2019-09-03 MED ORDER — CHLORHEXIDINE GLUCONATE CLOTH 2 % EX PADS
6.0000 | MEDICATED_PAD | Freq: Every day | CUTANEOUS | Status: DC
  Administered 2019-09-04 – 2019-09-07 (×2): 6 via TOPICAL

## 2019-09-03 MED ORDER — BUPIVACAINE HCL 0.25 % IJ SOLN
INTRAMUSCULAR | Status: AC
  Filled 2019-09-03: qty 1

## 2019-09-03 MED ORDER — CHLORHEXIDINE GLUCONATE 0.12 % MT SOLN
15.0000 mL | Freq: Once | OROMUCOSAL | Status: AC
  Administered 2019-09-03: 15 mL via OROMUCOSAL

## 2019-09-03 MED ORDER — ENSURE SURGERY PO LIQD
237.0000 mL | Freq: Two times a day (BID) | ORAL | Status: DC
  Administered 2019-09-03 – 2019-09-07 (×4): 237 mL via ORAL
  Filled 2019-09-03 (×9): qty 237

## 2019-09-03 MED ORDER — SUCCINYLCHOLINE CHLORIDE 200 MG/10ML IV SOSY
PREFILLED_SYRINGE | INTRAVENOUS | Status: AC
  Filled 2019-09-03: qty 10

## 2019-09-03 MED ORDER — PROMETHAZINE HCL 25 MG/ML IJ SOLN: 6.2500 mg | INTRAMUSCULAR | Status: DC | PRN

## 2019-09-03 MED ORDER — FENTANYL CITRATE (PF) 250 MCG/5ML IJ SOLN
INTRAMUSCULAR | Status: AC
  Filled 2019-09-03: qty 5

## 2019-09-03 MED ORDER — ALVIMOPAN 12 MG PO CAPS
12.0000 mg | ORAL_CAPSULE | ORAL | Status: AC
  Administered 2019-09-03: 12 mg via ORAL
  Filled 2019-09-03: qty 1

## 2019-09-03 MED ORDER — CHLORHEXIDINE GLUCONATE 4 % EX LIQD: 1.0000 "application " | Freq: Once | CUTANEOUS | Status: DC

## 2019-09-03 MED ORDER — MIDAZOLAM HCL 2 MG/2ML IJ SOLN
INTRAMUSCULAR | Status: AC
  Filled 2019-09-03: qty 2

## 2019-09-03 MED ORDER — KETAMINE HCL 10 MG/ML IJ SOLN
INTRAMUSCULAR | Status: DC | PRN
  Administered 2019-09-03 (×2): 15 mg via INTRAVENOUS

## 2019-09-03 MED ORDER — OXYCODONE HCL 5 MG PO TABS: 5.0000 mg | ORAL_TABLET | Freq: Once | ORAL | Status: DC | PRN

## 2019-09-03 MED ORDER — ONDANSETRON HCL 4 MG PO TABS: 4.0000 mg | ORAL_TABLET | Freq: Four times a day (QID) | ORAL | Status: DC | PRN

## 2019-09-03 MED ORDER — ACETAMINOPHEN 500 MG PO TABS
1000.0000 mg | ORAL_TABLET | ORAL | Status: AC
  Administered 2019-09-03: 1000 mg via ORAL
  Filled 2019-09-03: qty 2

## 2019-09-03 MED ORDER — HYDROMORPHONE HCL 1 MG/ML IJ SOLN
0.5000 mg | INTRAMUSCULAR | Status: AC | PRN
  Administered 2019-09-03 (×4): 0.5 mg via INTRAVENOUS

## 2019-09-03 MED ORDER — PROPOFOL 10 MG/ML IV BOLUS
INTRAVENOUS | Status: DC | PRN
  Administered 2019-09-03: 60 mg via INTRAVENOUS
  Administered 2019-09-03: 40 mg via INTRAVENOUS
  Administered 2019-09-03: 140 mg via INTRAVENOUS

## 2019-09-03 MED ORDER — DIPHENHYDRAMINE HCL 25 MG PO CAPS: 25.0000 mg | ORAL_CAPSULE | Freq: Four times a day (QID) | ORAL | Status: DC | PRN

## 2019-09-03 MED ORDER — DIPHENHYDRAMINE HCL 50 MG/ML IJ SOLN: 25.0000 mg | Freq: Four times a day (QID) | INTRAMUSCULAR | Status: DC | PRN

## 2019-09-03 MED ORDER — DEXAMETHASONE SODIUM PHOSPHATE 10 MG/ML IJ SOLN
INTRAMUSCULAR | Status: AC
  Filled 2019-09-03: qty 1

## 2019-09-03 MED ORDER — KETOROLAC TROMETHAMINE 30 MG/ML IJ SOLN
30.0000 mg | Freq: Four times a day (QID) | INTRAMUSCULAR | Status: DC | PRN
  Administered 2019-09-07: 30 mg via INTRAVENOUS
  Filled 2019-09-03 (×5): qty 1

## 2019-09-03 MED ORDER — KETAMINE HCL 10 MG/ML IJ SOLN
INTRAMUSCULAR | Status: AC
  Filled 2019-09-03: qty 1

## 2019-09-03 MED ORDER — OXYCODONE HCL 5 MG/5ML PO SOLN: 5.0000 mg | Freq: Once | ORAL | Status: DC | PRN

## 2019-09-03 MED ORDER — FENTANYL CITRATE (PF) 100 MCG/2ML IJ SOLN
25.0000 ug | INTRAMUSCULAR | Status: DC | PRN
  Administered 2019-09-03 (×3): 50 ug via INTRAVENOUS

## 2019-09-03 MED ORDER — MIDAZOLAM HCL 5 MG/5ML IJ SOLN
INTRAMUSCULAR | Status: DC | PRN
  Administered 2019-09-03: 2 mg via INTRAVENOUS

## 2019-09-03 MED ORDER — ACETAMINOPHEN 500 MG PO TABS: 1000.0000 mg | ORAL_TABLET | Freq: Once | ORAL | Status: DC

## 2019-09-03 MED ORDER — HEPARIN SODIUM (PORCINE) 5000 UNIT/ML IJ SOLN
5000.0000 [IU] | Freq: Once | INTRAMUSCULAR | Status: AC
  Administered 2019-09-03: 5000 [IU] via SUBCUTANEOUS
  Filled 2019-09-03: qty 1

## 2019-09-03 SURGICAL SUPPLY — 59 items
BLADE HEX COATED 2.75 (ELECTRODE) ×1 IMPLANT
CHLORAPREP W/TINT 26 (MISCELLANEOUS) ×3 IMPLANT
COVER MAYO STAND STRL (DRAPES) ×1 IMPLANT
COVER SURGICAL LIGHT HANDLE (MISCELLANEOUS) ×4 IMPLANT
COVER WAND RF STERILE (DRAPES) IMPLANT
DRAPE LAPAROSCOPIC ABDOMINAL (DRAPES) ×1 IMPLANT
DRAPE SHEET LG 3/4 BI-LAMINATE (DRAPES) ×2 IMPLANT
DRAPE UTILITY XL STRL (DRAPES) IMPLANT
DRAPE WARM FLUID 44X44 (DRAPES) ×1 IMPLANT
DRSG OPSITE POSTOP 4X10 (GAUZE/BANDAGES/DRESSINGS) ×2 IMPLANT
DRSG OPSITE POSTOP 4X6 (GAUZE/BANDAGES/DRESSINGS) IMPLANT
DRSG OPSITE POSTOP 4X8 (GAUZE/BANDAGES/DRESSINGS) IMPLANT
DRSG TELFA 3X8 NADH (GAUZE/BANDAGES/DRESSINGS) IMPLANT
ELECT BLADE 6.5 .24CM SHAFT (ELECTRODE) ×2 IMPLANT
ELECT REM PT RETURN 15FT ADLT (MISCELLANEOUS) ×3 IMPLANT
GAUZE SPONGE 4X4 12PLY STRL (GAUZE/BANDAGES/DRESSINGS) ×3 IMPLANT
GLOVE BIO SURGEON STRL SZ7 (GLOVE) ×4 IMPLANT
GLOVE BIOGEL PI IND STRL 7.0 (GLOVE) ×1 IMPLANT
GLOVE BIOGEL PI INDICATOR 7.0 (GLOVE) ×16
GLOVE SURG ORTHO 8.0 STRL STRW (GLOVE) ×4 IMPLANT
GLOVE SURG SS PI 7.0 STRL IVOR (GLOVE) ×9 IMPLANT
GOWN STRL REUS W/TWL LRG LVL3 (GOWN DISPOSABLE) ×9 IMPLANT
GOWN STRL REUS W/TWL XL LVL3 (GOWN DISPOSABLE) ×8 IMPLANT
HANDLE SUCTION POOLE (INSTRUMENTS) ×1 IMPLANT
HOLDER FOLEY CATH W/STRAP (MISCELLANEOUS) ×2 IMPLANT
KIT TURNOVER KIT A (KITS) IMPLANT
MANIFOLD NEPTUNE II (INSTRUMENTS) ×3 IMPLANT
PAD DRESSING TELFA 3X8 NADH (GAUZE/BANDAGES/DRESSINGS) IMPLANT
PENCIL SMOKE EVACUATOR (MISCELLANEOUS) ×2 IMPLANT
RELOAD PROXIMATE 75MM BLUE (ENDOMECHANICALS) IMPLANT
RELOAD STAPLE 75 3.8 BLU REG (ENDOMECHANICALS) IMPLANT
SPONGE LAP 18X18 RF (DISPOSABLE) ×4 IMPLANT
STAPLER 90 3.5 STAND SLIM (STAPLE) ×3
STAPLER 90 3.5 STD SLIM (STAPLE) IMPLANT
STAPLER CIRC ILS CVD 33MM 37CM (STAPLE) ×2 IMPLANT
STAPLER GUN LINEAR PROX 60 (STAPLE) IMPLANT
STAPLER PROXIMATE 75MM BLUE (STAPLE) ×2 IMPLANT
STAPLER VISISTAT 35W (STAPLE) ×2 IMPLANT
SUCTION POOLE HANDLE (INSTRUMENTS) ×3
SUT NOVA NAB DX-16 0-1 5-0 T12 (SUTURE) IMPLANT
SUT NOVA NAB GS-21 0 18 T12 DT (SUTURE) ×2 IMPLANT
SUT PDS AB 0 CT1 36 (SUTURE) ×8 IMPLANT
SUT PROLENE 2 0 BLUE (SUTURE) IMPLANT
SUT PROLENE 2 0 KS (SUTURE) ×2 IMPLANT
SUT SILK 2 0 (SUTURE) ×2
SUT SILK 2 0 SH CR/8 (SUTURE) ×3 IMPLANT
SUT SILK 2-0 18XBRD TIE 12 (SUTURE) ×1 IMPLANT
SUT SILK 3 0 (SUTURE) ×2
SUT SILK 3 0 SH CR/8 (SUTURE) ×7 IMPLANT
SUT SILK 3-0 18XBRD TIE 12 (SUTURE) ×1 IMPLANT
SUT VIC AB 2-0 SH 18 (SUTURE) IMPLANT
SUT VIC AB 2-0 SH 27 (SUTURE) ×4
SUT VIC AB 2-0 SH 27X BRD (SUTURE) ×2 IMPLANT
SUT VICRYL 4-0 PS2 18IN ABS (SUTURE) ×1 IMPLANT
TOWEL OR 17X26 10 PK STRL BLUE (TOWEL DISPOSABLE) ×4 IMPLANT
TOWEL OR NON WOVEN STRL DISP B (DISPOSABLE) ×4 IMPLANT
TRAY COLON PACK (CUSTOM PROCEDURE TRAY) ×2 IMPLANT
TRAY FOLEY MTR SLVR 16FR STAT (SET/KITS/TRAYS/PACK) ×2 IMPLANT
YANKAUER SUCT BULB TIP NO VENT (SUCTIONS) ×1 IMPLANT

## 2019-09-03 NOTE — Transfer of Care (Signed)
Immediate Anesthesia Transfer of Care Note  Patient: Daniel Mcmillan  Procedure(s) Performed: ILEOSTOMY REVERSAL, COLOSTOMY REVERSAL, WITH COLORECTAL ANASTOMOSIS (N/A ) RIGID PROCTOSCOPY (N/A )  Patient Location: PACU  Anesthesia Type:General  Level of Consciousness: awake, alert , oriented and patient cooperative  Airway & Oxygen Therapy: Patient Spontanous Breathing and Patient connected to face mask oxygen  Post-op Assessment: Report given to RN, Post -op Vital signs reviewed and stable and Patient moving all extremities  Post vital signs: Reviewed and stable  Last Vitals:  Vitals Value Taken Time  BP 151/98 09/03/19 1136  Temp    Pulse 86 09/03/19 1138  Resp 14 09/03/19 1138  SpO2 100 % 09/03/19 1138  Vitals shown include unvalidated device data.  Last Pain:  Vitals:   09/03/19 0610  TempSrc:   PainSc: 0-No pain         Complications: No apparent anesthesia complications

## 2019-09-03 NOTE — H&P (Signed)
Daniel Mcmillan is an 49 y.o. male.   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 Diverticulosis   . Essential hypertension 04/19/2019   pt denies. only while he was in the hospital  . ETOH abuse     Past Surgical History:  Procedure Laterality Date  . COLECTOMY WITH COLOSTOMY CREATION/HARTMANN PROCEDURE  04/17/2019   LAR with descending colon mucus fistula  . DIAGNOSTIC LAPAROSCOPY    . HERNIA REPAIR    . ILEOCECETOMY  04/17/2019   Cecal perforation from sigmoid colon stricture  . ILEOSTOMY  04/17/2019  . INGUINAL HERNIA REPAIR    . IR RADIOLOGIST EVAL & MGMT  05/21/2017   Drainage diverticular abscess  . IR RADIOLOGIST EVAL & MGMT  05/13/2019  . LAPAROTOMY N/A 04/17/2019   Procedure: EXPLORATORY LAPAROTOMY; RIGHT COLECTOMY WITH END ILEOSTOMY; LOW ANTERIOR RESECTION WITH END COLOSTOMY;  Surgeon: Keylin Podolsky, Arta Bruce, MD;  Location: [facility];  Service: General;  Laterality: N/A;    History reviewed. No pertinent family history. Social History:  reports that he has never smoked. He has never used smokeless tobacco. He reports current alcohol use of about 42.0 standard drinks of alcohol per week. He reports current drug use. Drug: Marijuana.  Allergies: No Known Allergies  Medications Prior to Admission  Medication Sig Dispense Refill  . diphenhydrAMINE (BENADRYL) 25 MG tablet Take 50 mg by mouth at bedtime as needed for allergies.    Marland Kitchen omeprazole (PRILOSEC) 20 MG capsule Take 20 mg by mouth daily.    Marland Kitchen acetaminophen (TYLENOL) 500 MG tablet Take 2 tablets (1,000 mg total) by mouth every 6 (six) hours as needed. (Patient not taking: Reported on 08/17/2019) 30 tablet 0  . gabapentin (NEURONTIN) 100 MG capsule Take 1 capsule (100 mg total) by mouth 2 (two) times daily. (Patient not taking: Reported on 08/17/2019) 30 capsule 0  .  ibuprofen (ADVIL,MOTRIN) 200 MG tablet You can take 2-3 tablets every 6 hours as needed for pain.  You can alternate this with plain Tylenol or the Tylenol 3.  You can buy this over-the-counter at any drugstore. (Patient not taking: Reported on 08/17/2019)    . lisinopril (PRINIVIL,ZESTRIL) 20 MG tablet Take 1 tablet daily.  Check your blood pressure at home at least 2 times per day and record.  Take this information to your primary care physician.  You should follow-up with primary care physician in 2-3 weeks.  We will not refill this prescription. (Patient not taking: Reported on 04/17/2019) 30 tablet 0  . loperamide (IMODIUM) 2 MG capsule Take 2 capsules (4 mg total) by mouth 2 (two) times daily. (Patient not taking: Reported on 08/17/2019) 30 capsule 0  . methocarbamol (ROBAXIN) 500 MG tablet Take 2 tablets (1,000 mg total) by mouth 3 (three) times daily. (Patient not taking: Reported on 08/17/2019) 30 tablet 0  . oxyCODONE 10 MG TABS Take 0.5-1 tablets (5-10 mg total) by mouth every 6 (six) hours as needed for moderate pain or severe pain (5mg  for moderate pain, 10mg  for severe pain). (Patient not taking: Reported on 08/17/2019) 30 tablet 0  . polycarbophil (FIBERCON) 625 MG tablet Take 1 tablet (625 mg total) by mouth daily. (Patient not taking: Reported on 08/17/2019)    . saccharomyces boulardii (FLORASTOR) 250 MG capsule You can buy this over-the-counter at any drugstore.  Follow package directions.  Use this  until you have completed your course of oral antibiotics. (Patient not taking: Reported on 08/17/2019)    . sodium chloride flush (NS) 0.9 % SOLN 5 mLs by Intracatheter route daily. (Patient not taking: Reported on 08/17/2019) 5 Syringe 1    Results for orders placed or performed during the hospital encounter of 09/02/19 (from the past 48 hour(s))  SARS CORONAVIRUS 2 (TAT 6-24 HRS) Nasopharyngeal Nasopharyngeal Swab     Status: None   Collection Time: 09/02/19  2:59 PM   Specimen: Nasopharyngeal  Swab  Result Value Ref Range   SARS Coronavirus 2 NEGATIVE NEGATIVE    Comment: (NOTE) SARS-CoV-2 target nucleic acids are NOT DETECTED. The SARS-CoV-2 RNA is generally detectable in upper and lower respiratory specimens during the acute phase of infection. Negative results do not preclude SARS-CoV-2 infection, do not rule out co-infections with other pathogens, and should not be used as the sole basis for treatment or other patient management decisions. Negative results must be combined with clinical observations, patient history, and epidemiological information. The expected result is Negative. Fact Sheet for Patients: SugarRoll.be Fact Sheet for Healthcare Providers: https://www.woods-mathews.com/ This test is not yet approved or cleared by the [place] FDA and  has been authorized for detection and/or diagnosis of SARS-CoV-2 by FDA under an Emergency Use Authorization (EUA). This EUA will remain  in effect (meaning this test can be used) for the duration of the COVID-19 declaration under Section 56 4(b)(1) of the Act, 21 U.S.C. section 360bbb-3(b)(1), unless the authorization is terminated or revoked sooner. Performed at [facility], [place] [address] [place], [place] [address]    No results found.  Review of Systems  Constitutional: Negative for chills and fever.  HENT: Negative for hearing loss.   Respiratory: Negative for cough.   Cardiovascular: Negative for chest pain and palpitations.  Gastrointestinal: Negative for abdominal pain, nausea and vomiting.  Genitourinary: Negative for dysuria and urgency.  Musculoskeletal: Negative for myalgias and neck pain.  Skin: Negative for rash.  Neurological: Negative for dizziness and headaches.  Hematological: Does not bruise/bleed easily.  Psychiatric/Behavioral: Negative for suicidal ideas.    Blood pressure 121/83, pulse 80, temperature 98.1 F (36.7 C), temperature  source Oral, resp. rate 18, height 5\' 10"  (1.778 m), weight 68 kg, SpO2 100 %. Physical Exam  Nursing note and vitals reviewed. Constitutional: He is oriented to person, place, and time. He appears well-developed and well-nourished.  HENT:  Head: Normocephalic and atraumatic.  Eyes: Conjunctivae and EOM are normal. No scleral icterus.  Cardiovascular: Normal rate and regular rhythm.  Respiratory: Effort normal and breath sounds normal. He has no wheezes. He has no rales. He exhibits no tenderness.  GI: Soft. He exhibits no distension. There is no abdominal tenderness. There is no rebound.  Right sided ileostomy and left sided mucous fistula patent and pink  Musculoskeletal:        General: No edema. Normal range of motion.     Cervical back: Normal range of motion and neck supple.  Neurological: He is alert and oriented to person, place, and time.  Skin: Skin is warm and dry.  Psychiatric: He has a normal mood and affect. His behavior is normal.     Assessment/Plan 50 yo male with history of cecal perforation related to sigmoid stricture -open colostomy and ileostomy reversal -inpatient [facility] admission  Mickeal Skinner, MD 09/03/2019, 7:23 AM

## 2019-09-03 NOTE — Anesthesia Postprocedure Evaluation (Signed)
Anesthesia Post Note  Patient: [name]  Procedure(s) Performed: ILEOSTOMY REVERSAL, COLOSTOMY REVERSAL, WITH COLORECTAL ANASTOMOSIS (N/A ) RIGID PROCTOSCOPY (N/A )     Patient location during evaluation: PACU Anesthesia Type: General Level of consciousness: awake and alert and oriented Pain management: pain level controlled Vital Signs Assessment: post-procedure vital signs reviewed and stable Respiratory status: spontaneous breathing, nonlabored ventilation and respiratory function stable Cardiovascular status: blood pressure returned to baseline Postop Assessment: no apparent nausea or vomiting Anesthetic complications: no    Last Vitals:  Vitals:   09/03/19 1300 09/03/19 1331  BP: (!) 150/94 (!) 155/103  Pulse: 70 80  Resp: 12 16  Temp:  36.7 C  SpO2: 99% 98%    Last Pain:  Vitals:   09/03/19 1331  TempSrc: Oral  PainSc:                  [name]

## 2019-09-03 NOTE — Op Note (Addendum)
Preoperative diagnosis: ileostomy care, colostomy care, diverticular stricture  Postoperative diagnosis: same   Procedure: ileostomy reversal with ileocolic anastomosis, colostomy reversal with colorectal anastomosis  Surgeon: Gurney Maxin, M.D.  Asst: [name], M.D.  Anesthesia: general  Indications for procedure: Daniel Mcmillan is a 49 y.o. year old male had diverticular stricture resulting in cecal perforation. He had 2 resections with 2 ostomies created. He presents today for reversal.  Description of procedure: The patient was brought into the operative suite. Anesthesia was administered with General endotracheal anesthesia. WHO checklist was applied. The patient was then placed in lithotomy position. The area was prepped and draped in the usual sterile fashion.  Next, vertical midline incision was made through the old scar.  The peritoneum was incised and the peritoneal cavity was carefully entered.  There were minimal adhesions of omentum to the previous scar these were taken down with cautery.  There are few additional adhesions to the left and right sidewall these were taken down with cautery as well.  The left colostomy was visualized and had no adhesions to the surrounding area.  The right ileostomy was visualized had a few adhesions to the omentum and abdominal wall.  The Prolenes were identified in the pelvis as the rectal stump.  Lysis of adhesions was then performed for 10 to 15 minutes with sharp dissection as well as blunt dissection.    Once the majority of the visceral contents were free the ileostomy was taken down by creating a elliptical incision in the skin dissecting down through subcutaneous tissues and identifying the plane between the fascia and the ileostomy serosa.  This allowed the ileostomy completely taken away from the abdominal wall.  The proximal colon was identified near the lateral edge of the liver dissection was done to free this up from the area of the  duodenum was identified and sent for safely left alone.  Next attention was turned towards the left colostomy and elliptical incision was made around the colostomy and the skin and careful dissection was used to dissect down through subcutaneous tissues and dissected away from the fascia.  This allowed the colostomy completely removed from the abdominal wall.  At this time the ileocolic anastomosis was performed by creating a ileostomy and colotomy and using a 75 blue load GIA stapler to create the anastomosis and 90 mm TA stapler was then used to divide off proximal colon and ileostomy sites.  The edges of the staple lines were imbricated with an antitension stitch was placed with 3-0 silk.  Pursestring device was placed and 2-0 prolene use to create a pursestring. 3-0 silk sutures were placed to ensure good grasp of the pursestring. The colostomy was identified and sized for a 33 mm anvil.   Next, additional pelvic dissection was done to free up the rectal stump away from the pelvic sidewalls.  Great care was taken to avoid injury to the rectum and surrounding structures.  Next Dr. Delila Spence went below and dilated up to the rectum showing that there was still some tension on the posterior wall.  Additional dissection was done in the left posterior left anterior areas to get better mobilization.  Next a 33 mm EEA stapler was introduced through the rectum and attached to the anvil and a colorectal anastomosis was made.  Proctoscopy was performed and air leak test was negative. The anastomosis was at 11 cm from the anal verge.  Colon protocol change over was performed. The abdomen was irrigated with 2 l of warm saline.  The fascia defects of the ileostomy and colostomy sites were closed with 0 PDS in running fashion. The midline skin was freed from the fascia with cautery. The fascia was closed with 0 PDS in running fashion. The scar was excised with cautery. 2-0 vicryl was used to pursestring the skin of the  ostomy sites and the wounds were packed with saline moistened gauze. Dressings were put in place. The patient awoke from anesthesia and tolerated the procedure well. He was transferred to pacu in stable condition.  Findings: patent colorectal and ileocolic anastomoses  Specimen: ileostomy and proximal colon  Implant: none   Blood loss: 50 ml  Local anesthesia: none  Complications: none  Gurney Maxin, M.D. General, Bariatric, & Minimally Invasive Surgery [facility]

## 2019-09-03 NOTE — Anesthesia Procedure Notes (Signed)

## 2019-09-04 LAB — BASIC METABOLIC PANEL
Anion gap: 8 (ref 5–15)
BUN: 19 mg/dL (ref 6–20)
CO2: 23 mmol/L (ref 22–32)
Calcium: 8.4 mg/dL — ABNORMAL LOW (ref 8.9–10.3)
Chloride: 103 mmol/L (ref 98–111)
Creatinine, Ser: 1.14 mg/dL (ref 0.61–1.24)
GFR calc Af Amer: >60 mL/min (ref >60)
GFR calc non Af Amer: >60 mL/min (ref >60)
Glucose, Bld: 135 mg/dL — ABNORMAL HIGH (ref 70–99)
Potassium: 5 mmol/L (ref 3.5–5.1)
Sodium: 134 mmol/L — ABNORMAL LOW (ref 135–145)

## 2019-09-04 LAB — CBC
HCT: 43 % (ref 39.0–52.0)
Hemoglobin: 14 g/dL (ref 13.0–17.0)
MCH: 27.9 pg (ref 26.0–34.0)
MCHC: 32.6 g/dL (ref 30.0–36.0)
MCV: 85.7 fL (ref 80.0–100.0)
Platelets: 240 10*3/uL (ref 150–400)
RBC: 5.02 MIL/uL (ref 4.22–5.81)
RDW: 14 % (ref 11.5–15.5)
WBC: 13.9 10*3/uL — ABNORMAL HIGH (ref 4.0–10.5)
nRBC: 0 % (ref 0.0–0.2)

## 2019-09-04 LAB — SURGICAL PATHOLOGY

## 2019-09-04 NOTE — Evaluation (Signed)
Physical Therapy Evaluation Patient Details Name: Daniel Mcmillan MRN: [id number] DOB: January 07, 1971 Today's Date: 09/04/2019   History of Present Illness  Pt s/p reversal of ileostomy and colostomy and with hx of ETOH abuse.  Clinical Impression  Pt admitted as above and demonstrating ability to mobilize at supervision level including ambulating 400' in hall - limited by increased pain only.  Will dc PT services at this time and refer pt to nursing to encourage further ambulation.    Follow Up Recommendations No PT follow up    Equipment Recommendations  None recommended by PT    Recommendations for Other Services       Precautions / Restrictions Precautions Precautions: None Restrictions Weight Bearing Restrictions: No      Mobility  Bed Mobility Overal bed mobility: Modified Independent             General bed mobility comments: Pt up on EOB on arrival and returned to supine at session end  Transfers Overall transfer level: Needs assistance   Transfers: Sit to/from Stand Sit to Stand: Supervision         General transfer comment: min cues for use of UEs to self assist  Ambulation/Gait Ambulation/Gait assistance: Min guard;Supervision Gait Distance (Feet): 400 Feet Assistive device: None Gait Pattern/deviations: Step-through pattern;Shuffle;Trunk flexed Gait velocity: mod pace   General Gait Details: mild general instability but no LOB.  Pt ambulating with blanket pad held on abdomen 2* discomfort  Stairs            Wheelchair Mobility    Modified Rankin (Stroke Patients Only)       Balance Overall balance assessment: Mild deficits observed, not formally tested                                           Pertinent Vitals/Pain Pain Assessment: 0-10 Pain Score: 7  Pain Location: abdominal pain Pain Descriptors / Indicators: Guarding;Sore Pain Intervention(s): Limited activity within patient's tolerance;Monitored during  session;Premedicated before session;Patient requesting pain meds-RN notified    Home Living Family/patient expects to be discharged to:: Private residence Living Arrangements: Parent Available Help at Discharge: Family;Available 24 hours/day Type of Home: House         Home Equipment: None      Prior Function Level of Independence: Independent               Hand Dominance        Extremity/Trunk Assessment   Upper Extremity Assessment Upper Extremity Assessment: Overall WFL for tasks assessed    Lower Extremity Assessment Lower Extremity Assessment: Overall WFL for tasks assessed       Communication   Communication: No difficulties  Cognition Arousal/Alertness: Awake/alert Behavior During Therapy: WFL for tasks assessed/performed Overall Cognitive Status: Within Functional Limits for tasks assessed                                        General Comments      Exercises     Assessment/Plan    PT Assessment Patent does not need any further PT services  PT Problem List         PT Treatment Interventions      PT Goals (Current goals can be found in the Care Plan section)  Acute Rehab PT Goals Patient Stated  Goal: less pain PT Goal Formulation: All assessment and education complete, DC therapy    Frequency     Barriers to discharge        Co-evaluation               AM-PAC PT "6 Clicks" Mobility  Outcome Measure Help needed turning from your back to your side while in a flat bed without using bedrails?: A Little Help needed moving from lying on your back to sitting on the side of a flat bed without using bedrails?: None Help needed moving to and from a bed to a chair (including a wheelchair)?: None Help needed standing up from a chair using your arms (e.g., wheelchair or bedside chair)?: None Help needed to walk in hospital room?: A Little Help needed climbing 3-5 steps with a railing? : A Little 6 Click Score: 21     End of Session   Activity Tolerance: Patient tolerated treatment well;Patient limited by pain Patient left: in bed;with call bell/phone within reach;with family/visitor present Nurse Communication: Mobility status PT Visit Diagnosis: Unsteadiness on feet (R26.81)    Time: 7948-0165 PT Time Calculation (min) (ACUTE ONLY): 13 min   Charges:   PT Evaluation $PT Eval Low Complexity: 1 Low          [place] Pager [phone] Office [phone]   Joslyn Ramos 09/04/2019, 1:18 PM

## 2019-09-04 NOTE — Progress Notes (Signed)
Progress Note: General Surgery Service   Chief Complaint/Subjective: +flatus, +eructations overnight, pain moderate  Objective: Vital signs in last 24 hours: Temp:  [97.4 F (36.3 C)-98.6 F (37 C)] 98.6 F (37 C) (06/04 0612) Pulse Rate:  [65-96] 75 (06/04 0612) Resp:  [12-20] 19 (06/04 0612) BP: (126-167)/(91-120) 143/93 (06/04 0612) SpO2:  [98 %-100 %] 99 % (06/04 0612) Weight:  [74.8 kg] 74.8 kg (06/04 0612) Last BM Date: 09/03/19(prior to surgery)  Intake/Output from previous day: 06/03 0701 - 06/04 0700 In: 3468.7 [P.O.:240; I.V.:3128.7; IV Piggyback:100] Out: 0272 [Urine:995; Blood:70] Intake/Output this shift: No intake/output data recorded.  Gen: NAD  Resp: nonlabored  Card: RRR  Abd: soft, slight distension, midline dressing with blood within the honeycomb, some seep through of left and right dressings.  Lab Results: CBC  Recent Labs    09/03/19 1359 09/04/19 0416  WBC 9.2 13.9*  HGB 14.9 14.0  HCT 46.0 43.0  PLT 251 240   BMET Recent Labs    09/03/19 1359 09/04/19 0416  NA  --  134*  K  --  5.0  CL  --  103  CO2  --  23  GLUCOSE  --  135*  BUN  --  19  CREATININE 1.08 1.14  CALCIUM  --  8.4*   PT/INR No results for input(s): LABPROT, INR in the last 72 hours. ABG No results for input(s): PHART, HCO3 in the last 72 hours.  Invalid input(s): PCO2, PO2  Anti-infectives: Anti-infectives (From admission, onward)   Start     Dose/Rate Route Frequency Ordered Stop   09/03/19 1400  neomycin (MYCIFRADIN) tablet 1,000 mg  Status:  Discontinued     1,000 mg Oral 3 times per day 09/03/19 0540 09/03/19 0548   09/03/19 1400  metroNIDAZOLE (FLAGYL) tablet 1,000 mg  Status:  Discontinued     1,000 mg Oral 3 times per day 09/03/19 0540 09/03/19 0548   09/03/19 0600  cefoTEtan (CEFOTAN) 2 g in sodium chloride 0.9 % 100 mL IVPB     2 g 200 mL/hr over 30 Minutes Intravenous On call to O.R. 09/03/19 0540 09/03/19 5366      Medications: Scheduled  Meds: . alvimopan  12 mg Oral BID  . Chlorhexidine Gluconate Cloth  6 each Topical Daily  . enoxaparin (LOVENOX) injection  40 mg Subcutaneous Q24H  . feeding supplement  237 mL Oral BID BM  . gabapentin  300 mg Oral BID  . metoprolol tartrate  5 mg Intravenous Q6H   Continuous Infusions: . sodium chloride 75 mL/hr at 09/04/19 0312   PRN Meds:.diphenhydrAMINE **OR** diphenhydrAMINE, ketorolac, metoprolol tartrate, morphine injection, ondansetron **OR** ondansetron (ZOFRAN) IV, oxyCODONE  Assessment/Plan: s/p Procedure(s): ILEOSTOMY REVERSAL, COLOSTOMY REVERSAL, WITH COLORECTAL ANASTOMOSIS RIGID PROCTOSCOPY 09/03/2019 -advance to full liquids -remove foley -ambulate -continue pain control -continue entereg    LOS: 1 day   Mickeal Skinner, MD [facility]

## 2019-09-05 LAB — CBC
HCT: 37.3 % — ABNORMAL LOW (ref 39.0–52.0)
Hemoglobin: 12.6 g/dL — ABNORMAL LOW (ref 13.0–17.0)
MCH: 27.9 pg (ref 26.0–34.0)
MCHC: 33.8 g/dL (ref 30.0–36.0)
MCV: 82.7 fL (ref 80.0–100.0)
Platelets: 206 10*3/uL (ref 150–400)
RBC: 4.51 MIL/uL (ref 4.22–5.81)
RDW: 13.6 % (ref 11.5–15.5)
WBC: 14.4 10*3/uL — ABNORMAL HIGH (ref 4.0–10.5)
nRBC: 0 % (ref 0.0–0.2)

## 2019-09-05 NOTE — Progress Notes (Signed)
2 Days Post-Op   Subjective/Chief Complaint: Feels bloated Minimal pain and passing flatus and BM's   Objective: Vital signs in last 24 hours: Temp:  [98.1 F (36.7 C)-99.1 F (37.3 C)] 98.8 F (37.1 C) (06/05 0617) Pulse Rate:  [73-107] 98 (06/05 0617) Resp:  [14-18] 18 (06/05 0617) BP: (141-152)/(88-101) 144/98 (06/05 0617) SpO2:  [98 %-100 %] 100 % (06/05 0617) Last BM Date: 09/03/19(prior to surgery)  Intake/Output from previous day: 06/04 0701 - 06/05 0700 In: 300 [P.O.:300] Out: 750 [Urine:750] Intake/Output this shift: No intake/output data recorded.  Exam: Ambulating in the room Abdomen distended, minimally tender, dressings dry  Lab Results:  Recent Labs    09/04/19 0416 09/05/19 0514  WBC 13.9* 14.4*  HGB 14.0 12.6*  HCT 43.0 37.3*  PLT 240 206   BMET Recent Labs    09/03/19 1359 09/04/19 0416  NA  --  134*  K  --  5.0  CL  --  103  CO2  --  23  GLUCOSE  --  135*  BUN  --  19  CREATININE 1.08 1.14  CALCIUM  --  8.4*   PT/INR No results for input(s): LABPROT, INR in the last 72 hours. ABG No results for input(s): PHART, HCO3 in the last 72 hours.  Invalid input(s): PCO2, PO2  Studies/Results: No results found.  Anti-infectives: Anti-infectives (From admission, onward)   Start     Dose/Rate Route Frequency Ordered Stop   09/03/19 1400  neomycin (MYCIFRADIN) tablet 1,000 mg  Status:  Discontinued     1,000 mg Oral 3 times per day 09/03/19 0540 09/03/19 0548   09/03/19 1400  metroNIDAZOLE (FLAGYL) tablet 1,000 mg  Status:  Discontinued     1,000 mg Oral 3 times per day 09/03/19 0540 09/03/19 0548   09/03/19 0600  cefoTEtan (CEFOTAN) 2 g in sodium chloride 0.9 % 100 mL IVPB     2 g 200 mL/hr over 30 Minutes Intravenous On call to O.R. 09/03/19 0540 09/03/19 8264      Assessment/Plan: s/p Procedure(s): ILEOSTOMY REVERSAL, COLOSTOMY REVERSAL, WITH COLORECTAL ANASTOMOSIS (N/A) RIGID PROCTOSCOPY (N/A)  Continue to ambulate Back down  on po if remains distended or nausea develops Follow WBC Continue IV fluids  LOS: 2 days    Coralie Keens 09/05/2019

## 2019-09-06 LAB — CBC
HCT: 34.4 % — ABNORMAL LOW (ref 39.0–52.0)
Hemoglobin: 11.8 g/dL — ABNORMAL LOW (ref 13.0–17.0)
MCH: 28.2 pg (ref 26.0–34.0)
MCHC: 34.3 g/dL (ref 30.0–36.0)
MCV: 82.1 fL (ref 80.0–100.0)
Platelets: 226 10*3/uL (ref 150–400)
RBC: 4.19 MIL/uL — ABNORMAL LOW (ref 4.22–5.81)
RDW: 13.5 % (ref 11.5–15.5)
WBC: 15 10*3/uL — ABNORMAL HIGH (ref 4.0–10.5)
nRBC: 0 % (ref 0.0–0.2)

## 2019-09-06 NOTE — Progress Notes (Signed)
3 Days Post-Op   Subjective/Chief Complaint: Having multiple BM's and minimal pain Tolerating fulls   Objective: Vital signs in last 24 hours: Temp:  [98.2 F (36.8 C)] 98.2 F (36.8 C) (06/05 2237) Pulse Rate:  [83-85] 83 (06/05 2237) Resp:  [17] 17 (06/05 2237) BP: (128-143)/(79-91) 128/79 (06/05 2237) SpO2:  [100 %] 100 % (06/05 2237) Last BM Date: 09/05/19  Intake/Output from previous day: 06/05 0701 - 06/06 0700 In: 4239.1 [P.O.:390; I.V.:3849.1] Out: -  Intake/Output this shift: No intake/output data recorded.  Exam: Abdomen with mild distension Ostomy sites clean,open wounds, midline intact  Lab Results:  Recent Labs    09/05/19 0514 09/06/19 0458  WBC 14.4* 15.0*  HGB 12.6* 11.8*  HCT 37.3* 34.4*  PLT 206 226   BMET Recent Labs    09/03/19 1359 09/04/19 0416  NA  --  134*  K  --  5.0  CL  --  103  CO2  --  23  GLUCOSE  --  135*  BUN  --  19  CREATININE 1.08 1.14  CALCIUM  --  8.4*   PT/INR No results for input(s): LABPROT, INR in the last 72 hours. ABG No results for input(s): PHART, HCO3 in the last 72 hours.  Invalid input(s): PCO2, PO2  Studies/Results: No results found.  Anti-infectives: Anti-infectives (From admission, onward)   Start     Dose/Rate Route Frequency Ordered Stop   09/03/19 1400  neomycin (MYCIFRADIN) tablet 1,000 mg  Status:  Discontinued     1,000 mg Oral 3 times per day 09/03/19 0540 09/03/19 0548   09/03/19 1400  metroNIDAZOLE (FLAGYL) tablet 1,000 mg  Status:  Discontinued     1,000 mg Oral 3 times per day 09/03/19 0540 09/03/19 0548   09/03/19 0600  cefoTEtan (CEFOTAN) 2 g in sodium chloride 0.9 % 100 mL IVPB     2 g 200 mL/hr over 30 Minutes Intravenous On call to O.R. 09/03/19 0540 09/03/19 8850      Assessment/Plan: s/p Procedure(s): ILEOSTOMY REVERSAL, COLOSTOMY REVERSAL, WITH COLORECTAL ANASTOMOSIS (N/A) RIGID PROCTOSCOPY (N/A)  WBC still up.  Will repeat in the morning Clinically he is doing  well Will start soft diet  LOS: 3 days    Coralie Keens 09/06/2019

## 2019-09-06 NOTE — Progress Notes (Signed)
Pharmacy Brief Note - Alvimopan (Entereg)  The standing order set for alvimopan (Entereg) now includes an automatic order to discontinue the drug after the patient has had a bowel movement. The change was approved by the [place] and the Medical Executive Committee.   This patient has had bowel movements documented by nursing and physician progress note. Therefore, alvimopan has been discontinued. If there are questions, please contact the pharmacy at [phone].   Thank you-  Minda Ditto PharmD 09/06/2019, 10:37 AM

## 2019-09-07 LAB — CBC
HCT: 34.4 % — ABNORMAL LOW (ref 39.0–52.0)
Hemoglobin: 11.6 g/dL — ABNORMAL LOW (ref 13.0–17.0)
MCH: 27.9 pg (ref 26.0–34.0)
MCHC: 33.7 g/dL (ref 30.0–36.0)
MCV: 82.7 fL (ref 80.0–100.0)
Platelets: 252 10*3/uL (ref 150–400)
RBC: 4.16 MIL/uL — ABNORMAL LOW (ref 4.22–5.81)
RDW: 13.5 % (ref 11.5–15.5)
WBC: 8.8 10*3/uL (ref 4.0–10.5)
nRBC: 0 % (ref 0.0–0.2)

## 2019-09-07 MED ORDER — OXYCODONE HCL 5 MG PO TABS: 5.0000 mg | ORAL_TABLET | ORAL | 0 refills | Status: AC | PRN

## 2019-09-07 NOTE — Progress Notes (Signed)
     Assessment & Plan: POD#4 - status post ileostomy closure, colostomy takedown  Tolerating regular diet  Patient comfortable with wound care  Pain controlled on oxycodone  OK for discharge home today  Will arrange follow up at [place] office with Dr. Scherrie Gerlach, [facility]       Office: [phone]   Chief Complaint: Colostomy / ileostomy closure  Subjective: Patient in bed, family in room.  Ready to go home.  Having BM's.  Tolerating diet.  Objective: Vital signs in last 24 hours: Temp:  [97.6 F (36.4 C)-98.2 F (36.8 C)] 97.6 F (36.4 C) (06/07 1245) Pulse Rate:  [71-90] 90 (06/07 0637) Resp:  [16-18] 16 (06/07 0637) BP: (124-139)/(91-98) 124/98 (06/07 0637) SpO2:  [100 %] 100 % (06/07 0637) Last BM Date: 09/06/19  Intake/Output from previous day: 06/06 0701 - 06/07 0700 In: 2276.9 [P.O.:1110; I.V.:1166.9] Out: 0  Intake/Output this shift: Total I/O In: 360 [P.O.:360] Out: -   Physical Exam: HEENT - sclerae clear, mucous membranes moist Neck - soft Chest - clear bilaterally Cor - RRR Abdomen - protuberant, BS present; dressings dry and intact Ext - no edema, non-tender Neuro - alert & oriented, no focal deficits  Lab Results:  Recent Labs    09/06/19 0458 09/07/19 0509  WBC 15.0* 8.8  HGB 11.8* 11.6*  HCT 34.4* 34.4*  PLT 226 252   BMET No results for input(s): NA, K, CL, CO2, GLUCOSE, BUN, CREATININE, CALCIUM in the last 72 hours. PT/INR No results for input(s): LABPROT, INR in the last 72 hours. Comprehensive Metabolic Panel:    Component Value Date/Time   NA 134 (L) 09/04/2019 0416   NA 136 08/26/2019 1523   K 5.0 09/04/2019 0416   K 4.0 08/26/2019 1523   CL 103 09/04/2019 0416   CL 107 08/26/2019 1523   CO2 23 09/04/2019 0416   CO2 20 (L) 08/26/2019 1523   BUN 19 09/04/2019 0416   BUN 13 08/26/2019 1523   CREATININE 1.14 09/04/2019 0416   CREATININE 1.08 09/03/2019 1359   GLUCOSE 135  (H) 09/04/2019 0416   GLUCOSE 79 08/26/2019 1523   CALCIUM 8.4 (L) 09/04/2019 0416   CALCIUM 8.8 (L) 08/26/2019 1523   AST 29 08/26/2019 1523   AST 23 04/17/2019 1905   ALT 23 08/26/2019 1523   ALT 19 04/17/2019 1905   ALKPHOS 86 08/26/2019 1523   ALKPHOS 57 04/17/2019 1905   BILITOT 0.4 08/26/2019 1523   BILITOT 1.2 04/17/2019 1905   PROT 7.6 08/26/2019 1523   PROT 5.7 (L) 04/17/2019 1905   ALBUMIN 3.9 08/26/2019 1523   ALBUMIN 2.7 (L) 04/17/2019 1905    Studies/Results: No results found.    Daniel Mcmillan 09/07/2019  Patient ID: Daniel Mcmillan, male   DOB: 1970-05-10, 49 y.o.   MRN: [id number]

## 2019-09-07 NOTE — Progress Notes (Signed)
Patient is irate and ready to leave the hospital. MD was paged and I was directed to page Dr Harlow Asa. I spoke with [facility] and had Dr Harlow Asa paged, patient is not satisfied with this and threatens to leave AMA.

## 2019-09-07 NOTE — Plan of Care (Signed)
Pt was discharged home today. Instructions were reviewed with patient, and questions were answered. Pt was taken to main entrance via wheelchair by NT.  

## 2019-09-23 NOTE — Discharge Summary (Signed)
Physician Discharge Summary  Daniel Mcmillan WNI:[id number] DOB: 06/25/1970 DOA: 09/03/2019  PCP: Patient, No Pcp Per  Admit date: 09/03/2019 Discharge date: 09/23/2019  Recommendations for Outpatient Follow-up:  1.  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 Post op he was admitted to the surgical floor. He was slowly advanced on diet. He ambulated well. He had return of bowel function and pain was controlled and he was discharged home.  Discharge Instructions  Discharge Instructions    Diet - low sodium heart healthy   Complete by: As directed    Discharge instructions   Complete by: As directed    [facility]  OPEN ABDOMINAL SURGERY: POST OP INSTRUCTIONS  Always review your discharge instruction sheet given to you by the facility where your surgery was performed.  A prescription for pain medication may be given to you upon discharge.  Take your pain medication as prescribed.  If narcotic pain medicine is not needed, then you may take acetaminophen (Tylenol) or ibuprofen (Advil) as needed. Take your usually prescribed medications unless otherwise directed. If you need a refill on your pain medication, please contact your pharmacy. They will contact our office to request authorization.  Prescriptions will not be filled after 5 pm or on weekends. You should follow a light diet the first few days after arrival home, such as soup and crackers, unless your doctor has advised otherwise. A high-fiber, low fat diet can be resumed as tolerated.  Be sure to include  plenty of fluids daily.  Most patients will experience some swelling and bruising in the area of the incision. Ice packs will help. Swelling and bruising can take several days to resolve. It is common to experience some constipation if taking pain medication after surgery.  Increasing fluid intake and taking a stool softener will usually help or prevent this problem from occurring.  A mild laxative (Milk of Magnesia or Miralax) should be taken according to package directions if there are no bowel movements after 48 hours.  You may have steri-strips (small skin tapes) in place directly over the incision.  These strips should be left on the skin for 5-7 days.  Any sutures or staples will be removed at the office during your follow-up visit. You may find that a light gauze bandage over your incision may keep your staples from being rubbed or pulled. You may shower and replace the bandage daily. ACTIVITIES:  You may resume regular (light) daily activities beginning the next day - such as daily self-care, walking, climbing stairs - gradually increasing activities as tolerated.  You may have sexual intercourse when it is comfortable.  Refrain from any heavy lifting or straining until approved by your doctor.  You may drive when you no longer are taking prescription pain medication, you can comfortably wear a seatbelt, and you can safely maneuver your car and apply brakes. You should see your doctor in the office for a follow-up appointment approximately 2-3 weeks after your surgery.  Make sure that you call for this appointment within a day or two after you arrive home to insure a convenient appointment time.  WHEN TO CALL YOUR DOCTOR:  Fever greater than 101.0 Inability to urinate Persistent nausea and/or vomiting Extreme swelling or bruising Continued bleeding from incision Increased pain, redness, or drainage from the incision Difficulty swallowing or breathing Muscle cramping or spasms Numbness or  tingling in hands or around lips  IF YOU HAVE DISABILITY OR FAMILY LEAVE FORMS, YOU MUST BRING THEM TO THE OFFICE FOR PROCESSING.  PLEASE DO NOT GIVE THEM TO YOUR DOCTOR.  The clinic staff is available to answer your questions during regular business hours.  Please don't hesitate to call and ask to speak to one of the nurses if you have concerns.  [facility] [place] Office: [phone]  For further questions, please visit [email or web address]   Discharge wound care:   Complete by: As directed    Patient to change open wounds with packing as instructed.  Midline wound may remain open to air.  OK to shower with wounds open.   Increase activity slowly   Complete by: As directed      Allergies as of 09/07/2019   No Known Allergies     Medication List    TAKE these medications   acetaminophen 500 MG tablet Commonly known as: TYLENOL Take 2 tablets (1,000 mg total) by mouth every 6 (six) hours as needed.   diphenhydrAMINE 25 MG tablet Commonly known as: BENADRYL Take 50 mg by mouth at bedtime as needed for allergies.   gabapentin 100 MG capsule Commonly known as: NEURONTIN Take 1 capsule (100 mg total) by mouth 2 (two) times daily.   ibuprofen 200 MG tablet Commonly known as: ADVIL You can take 2-3 tablets every 6 hours as needed for pain.  You can alternate this with plain Tylenol or the Tylenol 3.  You can buy this over-the-counter at any drugstore.   lisinopril 20 MG tablet Commonly known as: ZESTRIL Take 1 tablet daily.  Check your blood pressure at home at least 2 times per day and record.  Take this information to your primary care physician.  You should follow-up with primary care physician in 2-3 weeks.  We will not refill this prescription.   loperamide 2 MG capsule Commonly known as: IMODIUM Take 2 capsules (4 mg total) by mouth 2 (two) times daily.   methocarbamol 500 MG tablet Commonly known as: ROBAXIN Take 2 tablets (1,000 mg total) by  mouth 3 (three) times daily.   omeprazole 20 MG capsule Commonly known as: PRILOSEC Take 20 mg by mouth daily.   Oxycodone HCl 10 MG Tabs Take 0.5-1 tablets (5-10 mg total) by mouth every 6 (six) hours as needed for moderate pain or severe pain (5mg  for moderate pain, 10mg  for severe pain). What changed: Another medication with the same name was added. Make sure you understand how and when to take each.   oxyCODONE 5 MG immediate release tablet Commonly known as: Oxy IR/ROXICODONE Take 1-2 tablets (5-10 mg total) by mouth every 4 (four) hours as needed for moderate pain. What changed: You were already taking a medication with the same name, and this prescription was added. Make sure you understand how and when to take each.   polycarbophil 625 MG tablet Commonly known as: FIBERCON Take 1 tablet (625 mg total) by mouth daily.   saccharomyces boulardii 250 MG capsule Commonly known as: FLORASTOR You can buy this over-the-counter at any drugstore.  Follow package directions.  Use this until you have completed your course of oral antibiotics.   sodium chloride flush 0.9 % Soln Commonly known as: NS 5 mLs  by Intracatheter route daily.            Discharge Care Instructions  (From admission, onward)         Start     Ordered   09/07/19 0000  Discharge wound care:       Comments: Patient to change open wounds with packing as instructed.  Midline wound may remain open to air.  OK to shower with wounds open.   09/07/19 1231            The results of significant diagnostics from this hospitalization (including imaging, microbiology, ancillary and laboratory) are listed below for reference.    Significant Diagnostic Studies: No results found.  Labs: Basic Metabolic Panel: No results for input(s): NA, K, CL, CO2, GLUCOSE, BUN, CREATININE, CALCIUM, MG, PHOS in the last 168 hours. Liver Function Tests: No results for input(s): AST, ALT, ALKPHOS, BILITOT, PROT, ALBUMIN in  the last 168 hours.  CBC: No results for input(s): WBC, NEUTROABS, HGB, HCT, MCV, PLT in the last 168 hours.  CBG: No results for input(s): GLUCAP in the last 168 hours.  Active Problems:   Colostomy care [facility])   Time coordinating discharge: 15 min

## 2022-01-01 IMAGING — RF DG CYSTOGRAM 3+V
12 series · 12 of 12 positions shown · non-contrast
Comparison: None.

CLINICAL DATA: Diverticulitis, concern for bladder leak

EXAM:
CYSTOGRAM
TECHNIQUE: After catheterization of the urinary bladder following sterile
technique the bladder was filled with 250 mL Manzin 17.2% by
drip infusion. Serial spot images were obtained during bladder
filling.
FLUOROSCOPY TIME:  Fluoroscopy Time:  0.4 minutes
Radiation Exposure Index (if provided by the fluoroscopic device):
6.4 mGy

[Series 4: t abdomen supine · 0.15mm/px · 1 of 1 slices shown]
[im 1/1]
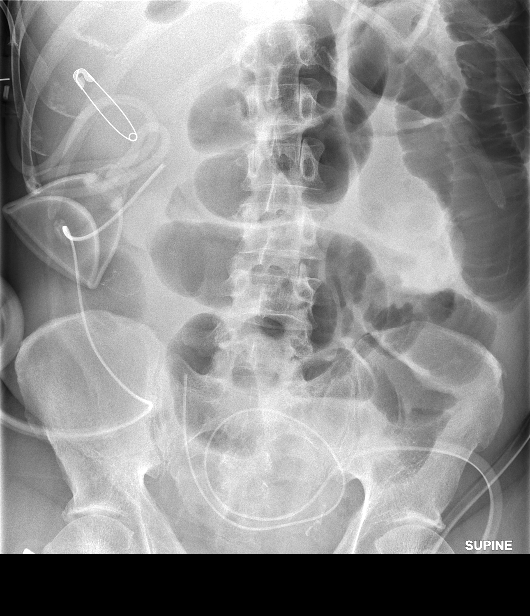

[Series 5: cp_standard · 0.26mm/px · 1 of 1 slices shown (1 of 11)]
[im 1/1]
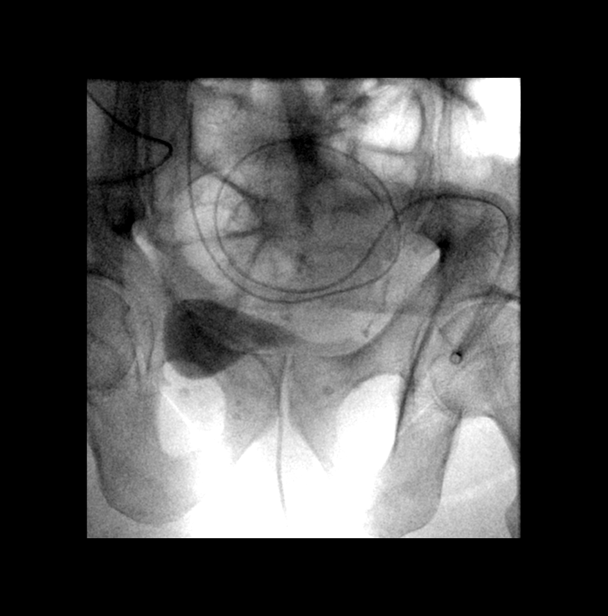

[Series 6: cp_standard · 0.26mm/px · 1 of 1 slices shown (2 of 11)]
[im 1/1]
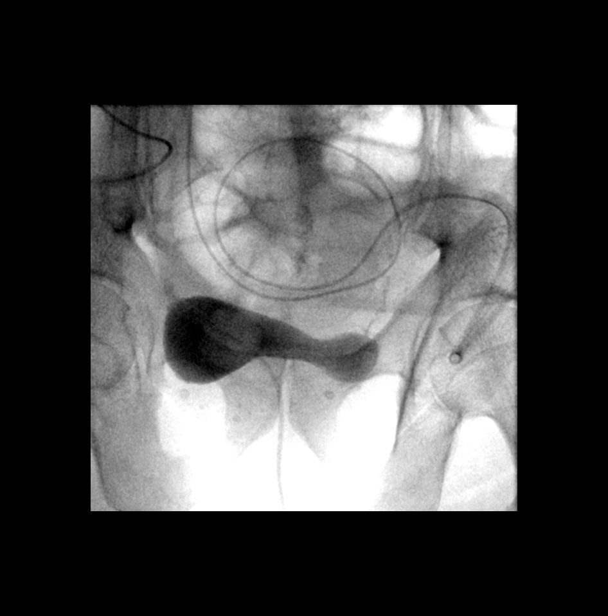

[Series 7: cp_standard · 0.26mm/px · 1 of 1 slices shown (3 of 11)]
[im 1/1]
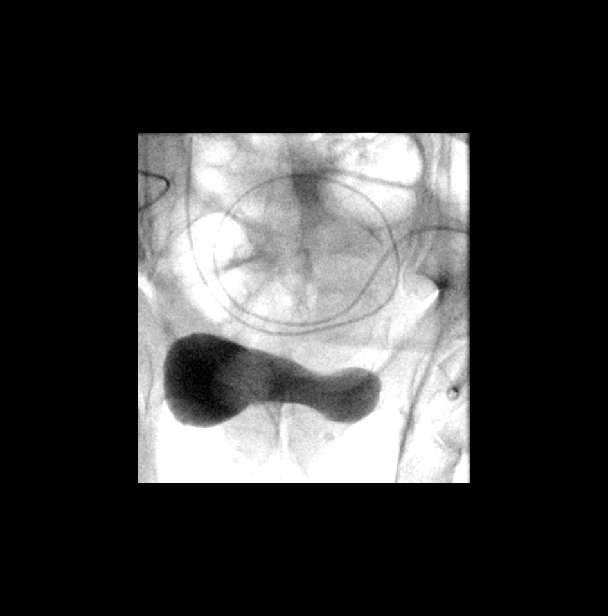

[Series 8: cp_standard · 0.26mm/px · 1 of 1 slices shown (4 of 11)]
[im 1/1]
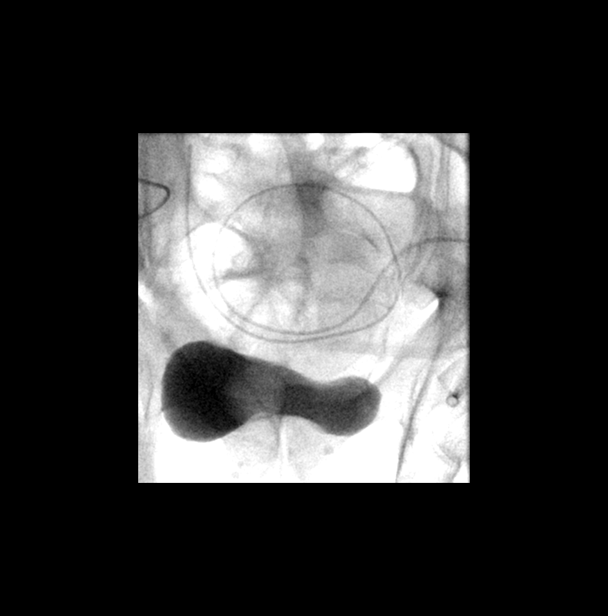

[Series 9: cp_standard · 0.26mm/px · 1 of 1 slices shown (5 of 11)]
[im 1/1]
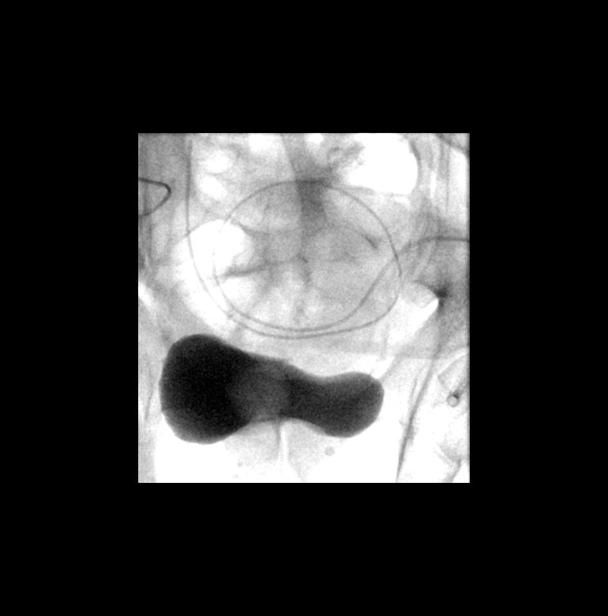

[Series 10: cp_standard · 0.26mm/px · 1 of 1 slices shown (6 of 11)]
[im 1/1]
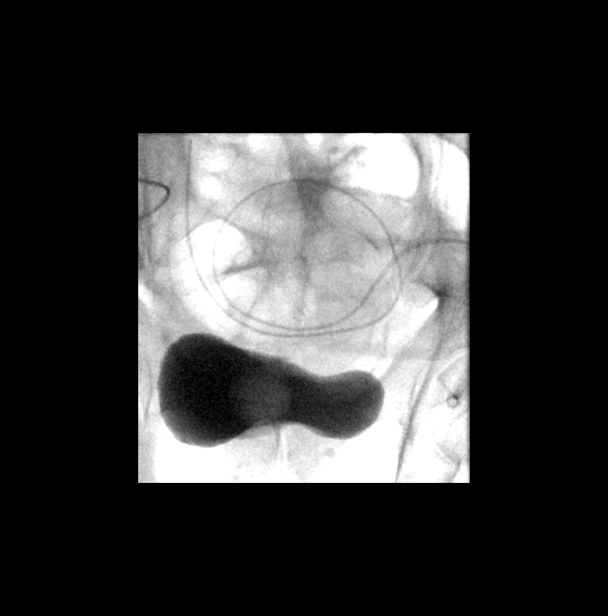

[Series 11: cp_standard · 0.26mm/px · 1 of 1 slices shown (7 of 11)]
[im 1/1]
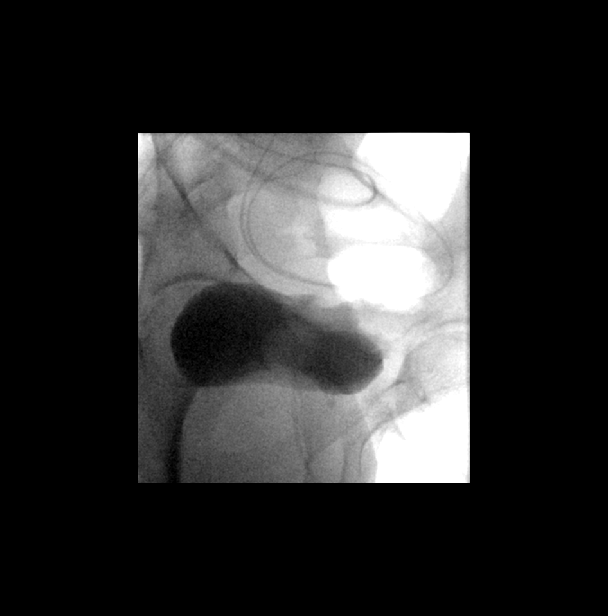

[Series 12: cp_standard · 0.26mm/px · 1 of 1 slices shown (8 of 11)]
[im 1/1]
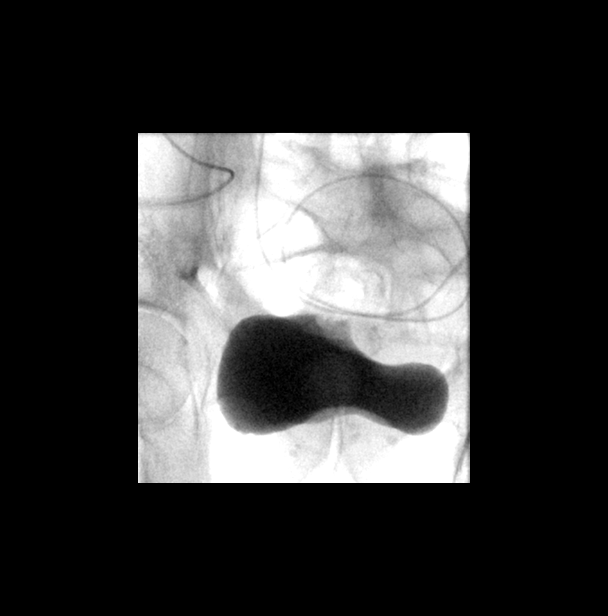

[Series 13: cp_standard · 0.26mm/px · 1 of 1 slices shown (9 of 11)]
[im 1/1]
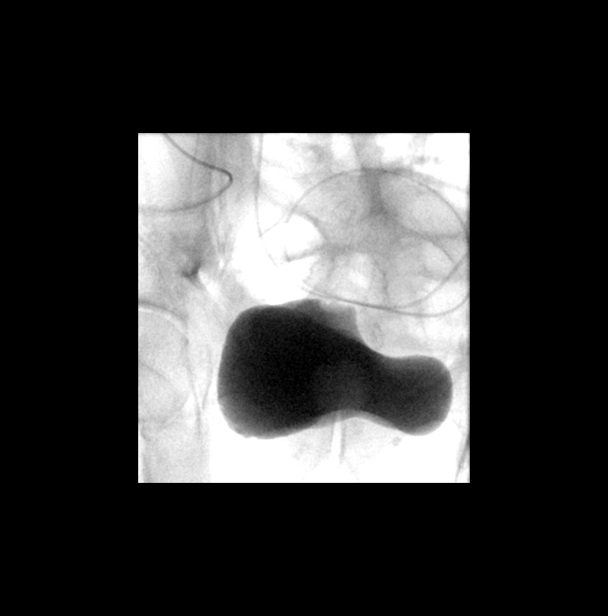

[Series 14: cp_standard · 0.26mm/px · 1 of 1 slices shown (10 of 11)]
[im 1/1]
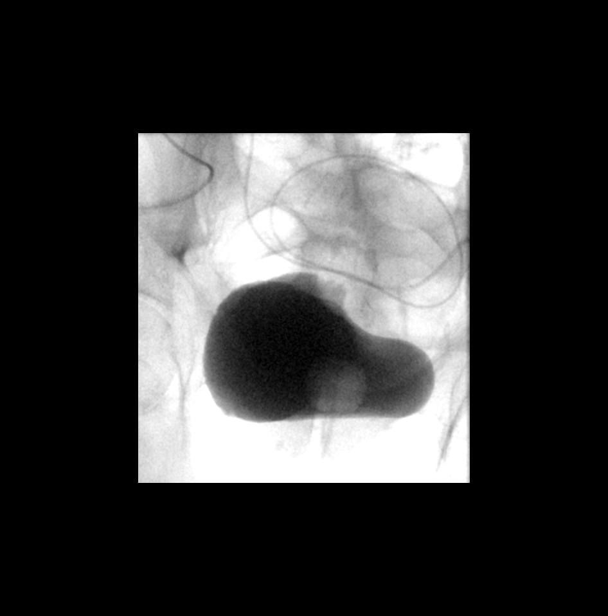

[Series 15: cp_standard · 0.25mm/px · 1 of 1 slices shown (11 of 11)]
[im 1/1]
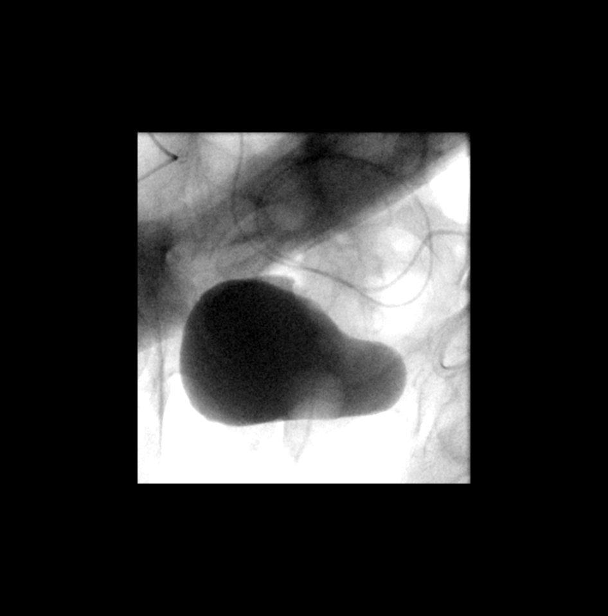

[12 of 12 positions shown; findings below may reference images not displayed]

FINDINGS: There is no evidence of contrast extravasation. There are no
diverticula. No intraluminal filling defects apart from Foley
balloon. There was no reflux into the ureters. Normal emptying.
IMPRESSION: No evidence of leak.

## 2022-01-20 IMAGING — CT CT ABD-PELV W/ CM
2 of 4 series · 12 of 46 positions shown, 14 images · IV contrast (iopamidol)
Comparison: Prior CT scan of the abdomen and pelvis 04/23/2019

CLINICAL DATA: 48-year-old male with a history of colonic
perforation treated with exploratory laparotomy, right hemicolectomy
with end ileostomy and low anterior resection with additional
colostomy creation in Wednesday April, 2019. Postoperative imaging
demonstrated multiple ill-defined intra-abdominal fluid collections.
A percutaneous drainage catheter was placed into the perihepatic
fluid collection on 04/24/2019. Patient presents today for
follow-up.

EXAM:
CT ABDOMEN AND PELVIS WITH CONTRAST
TECHNIQUE: Multidetector CT imaging of the abdomen and pelvis was performed
using the standard protocol following bolus administration of
intravenous contrast.
CONTRAST:  100mL [id number] IOPAMIDOL ([id number]-UYY) INJECTION 61%

[Series 2: abd pelvis 5.00 br40 s3 axial · axial · 0.56mm/px · z∈[+1195,+1605]mm · 9 of 100 slices shown, 11 images]
[im 9/100  soft-tissue]
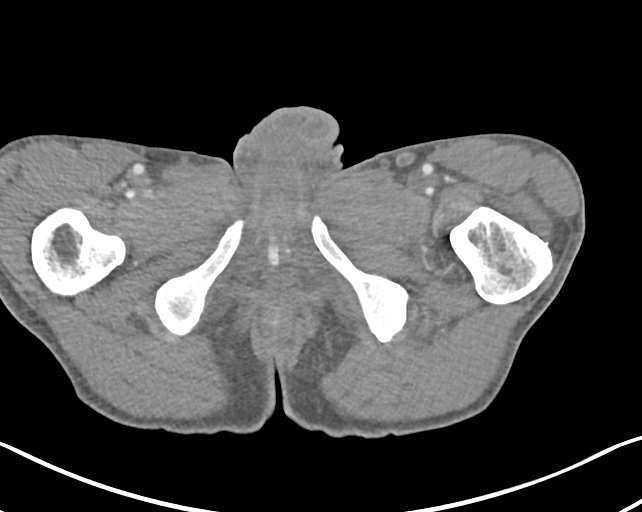
[im 9/100  bone]
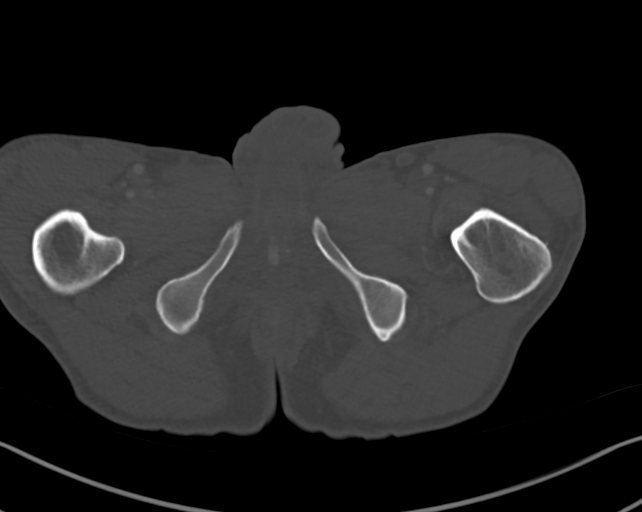
[im 17/100  soft-tissue]
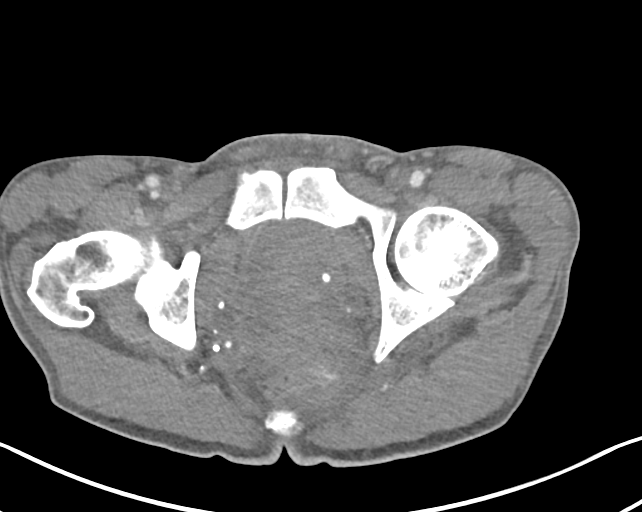
[im 29/100  soft-tissue]
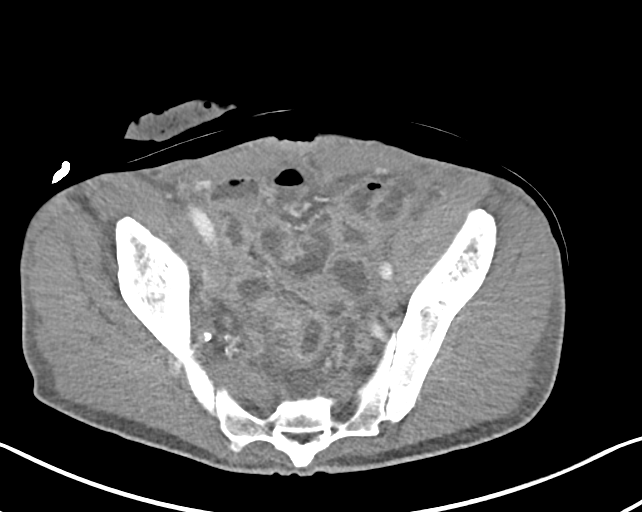
[im 38/100  soft-tissue]
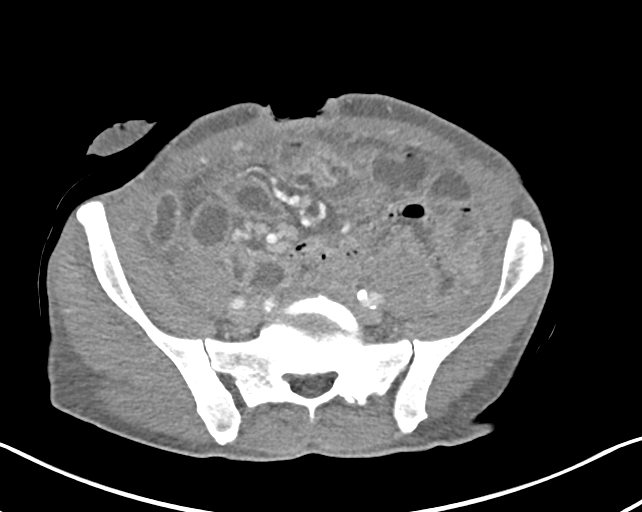
[im 50/100  soft-tissue]
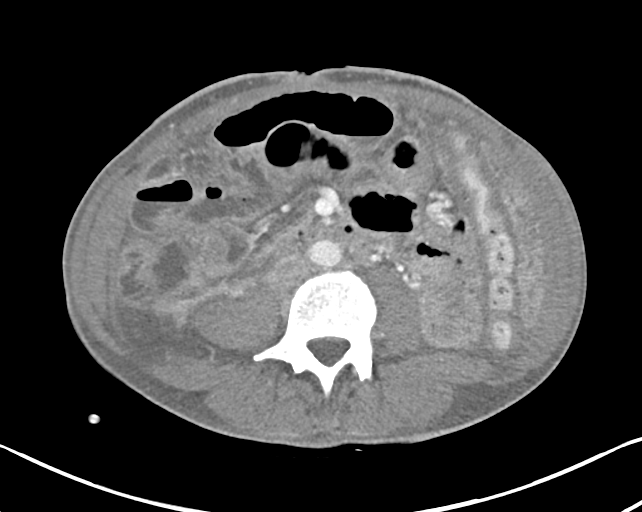
[im 62/100  soft-tissue]
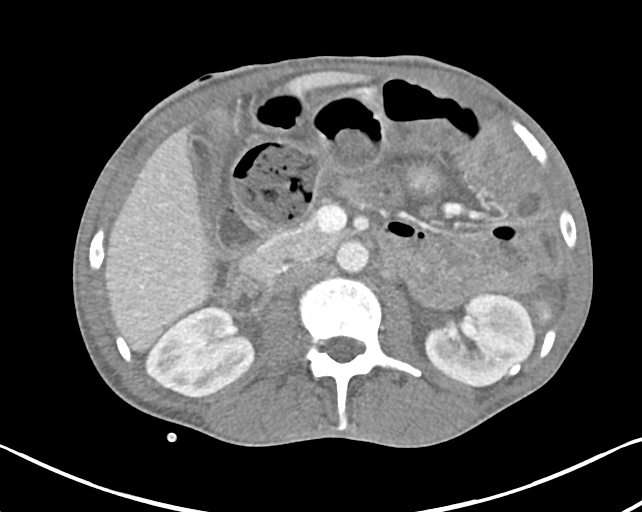
[im 71/100  soft-tissue]
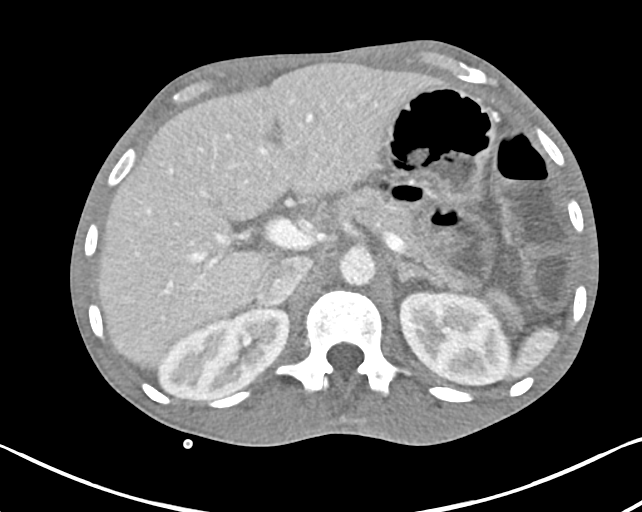
[im 83/100  soft-tissue]
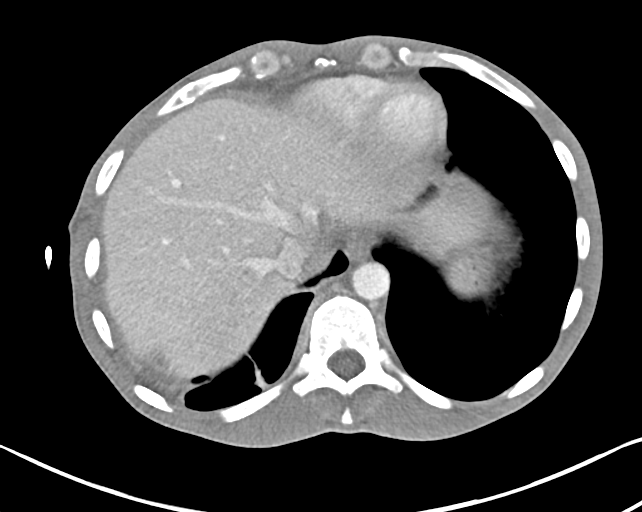
[im 91/100  soft-tissue]
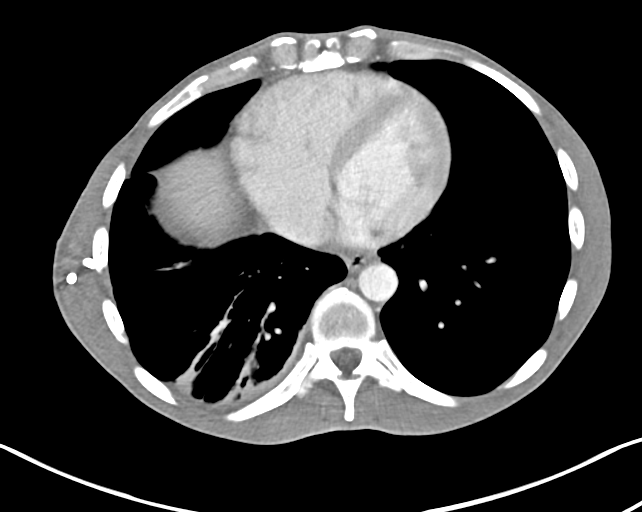
[im 91/100  bone]
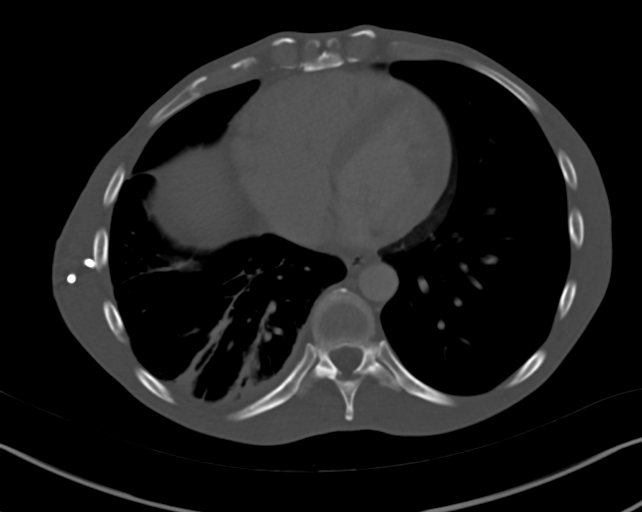

[Series 6: abd pelvis 2.00 br40 s3 cor · coronal · 0.70mm/px · 3 of 140 slices shown]
[im 47/140  soft-tissue]
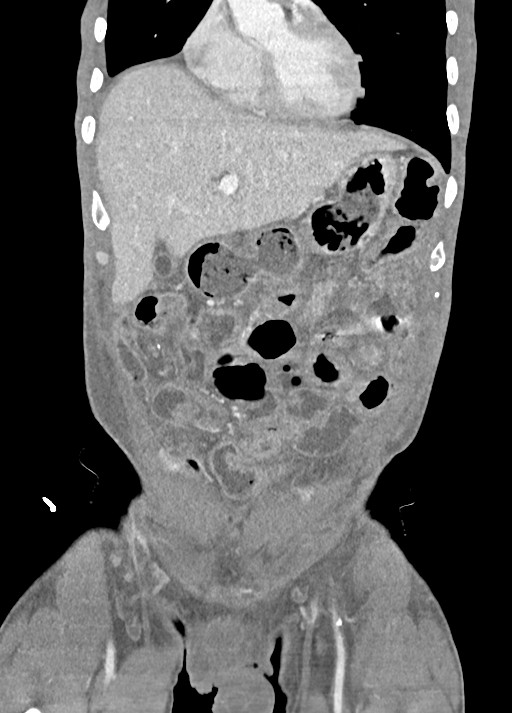
[im 62/140  soft-tissue]
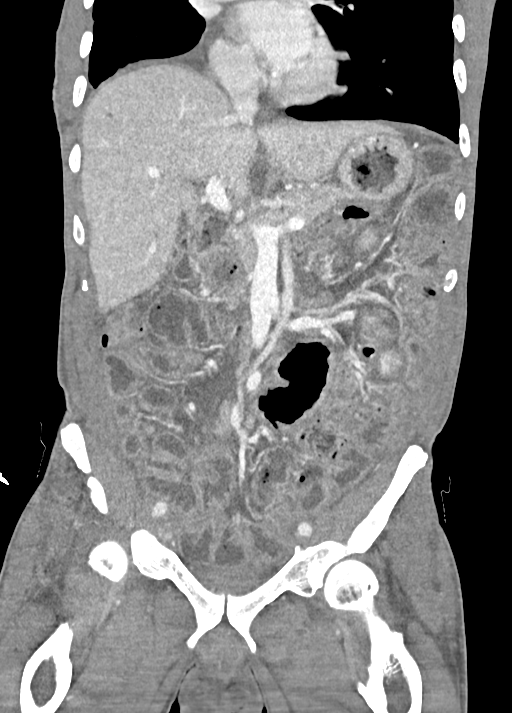
[im 78/140  soft-tissue]
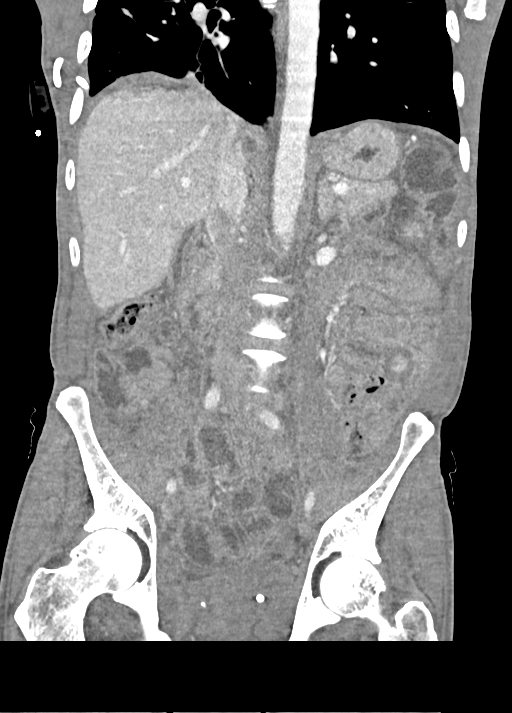

[12 of 46 positions shown; findings below may reference images not displayed]

FINDINGS: Lower chest: Trace right-sided pleural effusion with residual
atelectasis versus scarring in the right lower lobe. Findings are
improved compared to the prior imaging. The heart is within normal
limits for size. The remaining portions of the visualized lower
lungs are clear.

Hepatobiliary: Pigtail drainage catheter noted in the right
subdiaphragmatic space. There is been almost complete interval
resolution of perihepatic fluid compared to prior imaging. A trace
residual collection measures approximately 2.1 x 1.5 cm. Improving
fluid collection adjacent to the caudate lobe of the liver measures
3.2 x 2.3 cm compared to 6.2 x 4.1 cm previously. Normal hepatic
morphology. No evidence of biliary ductal dilatation. Stable small
cyst in the posterior aspect of hepatic segment 6.

Pancreas: Unremarkable. No pancreatic ductal dilatation or
surrounding inflammatory changes.

Spleen: Normal in size without focal abnormality.

Adrenals/Urinary Tract: Stable 1.5 cm left adrenal nodule. The right
adrenal gland remains unchanged and within normal limits. No
evidence of hydronephrosis, nephrolithiasis or enhancing renal mass.
Unremarkable ureters. The bladder is collapsed.

Stomach/Bowel: Surgical changes of right hemicolectomy with right
lower quadrant end ileostomy as well as low anterior resection with
left lower quadrant diverting colostomy. No focal bowel wall
thickening or evidence of obstruction.

Vascular/Lymphatic: No suspicious lymphadenopathy. Mild scattered
atherosclerotic vascular calcifications without focal stenosis.

Reproductive: Prostate gland is not well seen.

Other: Open incision with packing material along the lower abdomen.

Musculoskeletal: No acute fracture or aggressive appearing lytic or
blastic osseous lesion.
IMPRESSION: 1. Almost complete interval resolution of perihepatic fluid. There
is a very small sliver of subcapsular fluid adjacent to the existing
drainage catheter.
2. Improving perihepatic fluid adjacent to the caudate lobe.
3. No new intra-abdominal fluid collections.
4. Improved right pleural effusion with some residual atelectasis
versus scarring in the right lower lobe.
5. Additional findings as above without significant interval change.
